# Patient Record
Sex: Female | Born: 1965
Health system: Southern US, Community
[De-identification: ages and names within clinical notes are randomized; demographics above are authoritative.]

## PROBLEM LIST (undated history)

## (undated) DIAGNOSIS — R112 Nausea with vomiting, unspecified: Secondary | ICD-10-CM

## (undated) DIAGNOSIS — R51 Headache: Secondary | ICD-10-CM

## (undated) DIAGNOSIS — C50919 Malignant neoplasm of unspecified site of unspecified female breast: Secondary | ICD-10-CM

## (undated) DIAGNOSIS — Z923 Personal history of irradiation: Secondary | ICD-10-CM

## (undated) DIAGNOSIS — Z8719 Personal history of other diseases of the digestive system: Secondary | ICD-10-CM

## (undated) DIAGNOSIS — Z8669 Personal history of other diseases of the nervous system and sense organs: Secondary | ICD-10-CM

## (undated) DIAGNOSIS — Z9889 Other specified postprocedural states: Secondary | ICD-10-CM

## (undated) DIAGNOSIS — K219 Gastro-esophageal reflux disease without esophagitis: Secondary | ICD-10-CM

## (undated) DIAGNOSIS — D0510 Intraductal carcinoma in situ of unspecified breast: Secondary | ICD-10-CM

## (undated) HISTORY — DX: Personal history of other diseases of the digestive system: Z87.19

## (undated) HISTORY — DX: Personal history of other diseases of the nervous system and sense organs: Z86.69

## (undated) HISTORY — DX: Malignant neoplasm of unspecified site of unspecified female breast: C50.919

---

## 2006-12-12 HISTORY — PX: ABDOMINAL HYSTERECTOMY: SHX81

## 2008-12-12 HISTORY — PX: BREAST LUMPECTOMY: SHX2

## 2009-10-07 ENCOUNTER — Encounter: Admission: RE | Admit: 2009-10-07 | Discharge: 2009-10-07 | Payer: Self-pay | Admitting: General Surgery

## 2009-10-12 ENCOUNTER — Encounter: Admission: RE | Admit: 2009-10-12 | Discharge: 2009-10-12 | Payer: Self-pay | Admitting: General Surgery

## 2009-10-12 ENCOUNTER — Encounter (INDEPENDENT_AMBULATORY_CARE_PROVIDER_SITE_OTHER): Payer: Self-pay | Admitting: Diagnostic Radiology

## 2009-10-12 DIAGNOSIS — C50919 Malignant neoplasm of unspecified site of unspecified female breast: Secondary | ICD-10-CM

## 2009-10-12 HISTORY — DX: Malignant neoplasm of unspecified site of unspecified female breast: C50.919

## 2009-10-16 ENCOUNTER — Encounter: Admission: RE | Admit: 2009-10-16 | Discharge: 2009-10-16 | Payer: Self-pay | Admitting: General Surgery

## 2009-12-07 ENCOUNTER — Ambulatory Visit: Payer: Self-pay | Admitting: Oncology

## 2009-12-16 ENCOUNTER — Ambulatory Visit: Admission: RE | Admit: 2009-12-16 | Discharge: 2010-03-12 | Payer: Self-pay | Admitting: Radiation Oncology

## 2009-12-17 ENCOUNTER — Encounter: Payer: Self-pay | Admitting: Internal Medicine

## 2009-12-21 ENCOUNTER — Encounter: Payer: Self-pay | Admitting: Internal Medicine

## 2009-12-21 LAB — COMPREHENSIVE METABOLIC PANEL
AST: 12 U/L (ref 0–37)
Alkaline Phosphatase: 47 U/L (ref 39–117)
BUN: 13 mg/dL (ref 6–23)
Calcium: 9.6 mg/dL (ref 8.4–10.5)
Chloride: 104 mEq/L (ref 96–112)
Creatinine, Ser: 0.63 mg/dL (ref 0.40–1.20)
Total Bilirubin: 0.5 mg/dL (ref 0.3–1.2)

## 2009-12-21 LAB — CBC WITH DIFFERENTIAL/PLATELET
BASO%: 0.5 % (ref 0.0–2.0)
Basophils Absolute: 0 10*3/uL (ref 0.0–0.1)
EOS%: 3.1 % (ref 0.0–7.0)
HCT: 38.6 % (ref 34.8–46.6)
HGB: 13.4 g/dL (ref 11.6–15.9)
LYMPH%: 36.9 % (ref 14.0–49.7)
MCH: 32 pg (ref 25.1–34.0)
MCHC: 34.7 g/dL (ref 31.5–36.0)
MCV: 92.3 fL (ref 79.5–101.0)
MONO%: 5.7 % (ref 0.0–14.0)
NEUT%: 53.8 % (ref 38.4–76.8)

## 2009-12-24 ENCOUNTER — Encounter: Payer: Self-pay | Admitting: Oncology

## 2009-12-24 ENCOUNTER — Ambulatory Visit: Admission: RE | Admit: 2009-12-24 | Discharge: 2009-12-24 | Payer: Self-pay | Admitting: Oncology

## 2009-12-28 ENCOUNTER — Ambulatory Visit (HOSPITAL_BASED_OUTPATIENT_CLINIC_OR_DEPARTMENT_OTHER): Admission: RE | Admit: 2009-12-28 | Discharge: 2009-12-28 | Payer: Self-pay | Admitting: General Surgery

## 2010-01-01 ENCOUNTER — Encounter: Payer: Self-pay | Admitting: Internal Medicine

## 2010-01-01 LAB — CBC WITH DIFFERENTIAL/PLATELET
Basophils Absolute: 0 10*3/uL (ref 0.0–0.1)
EOS%: 0 % (ref 0.0–7.0)
Eosinophils Absolute: 0 10*3/uL (ref 0.0–0.5)
HCT: 38.9 % (ref 34.8–46.6)
HGB: 13.7 g/dL (ref 11.6–15.9)
LYMPH%: 7 % — ABNORMAL LOW (ref 14.0–49.7)
MONO#: 0 10*3/uL — ABNORMAL LOW (ref 0.1–0.9)
NEUT#: 9.5 10*3/uL — ABNORMAL HIGH (ref 1.5–6.5)
NEUT%: 92.7 % — ABNORMAL HIGH (ref 38.4–76.8)
Platelets: 194 10*3/uL (ref 145–400)
RBC: 4.45 10*6/uL (ref 3.70–5.45)
WBC: 10.2 10*3/uL (ref 3.9–10.3)

## 2010-01-01 LAB — COMPREHENSIVE METABOLIC PANEL
Albumin: 4.4 g/dL (ref 3.5–5.2)
BUN: 11 mg/dL (ref 6–23)
CO2: 21 mEq/L (ref 19–32)
Glucose, Bld: 107 mg/dL — ABNORMAL HIGH (ref 70–99)
Potassium: 4.2 mEq/L (ref 3.5–5.3)
Sodium: 138 mEq/L (ref 135–145)
Total Bilirubin: 0.4 mg/dL (ref 0.3–1.2)
Total Protein: 7.2 g/dL (ref 6.0–8.3)

## 2010-01-06 ENCOUNTER — Ambulatory Visit: Payer: Self-pay | Admitting: Oncology

## 2010-01-08 ENCOUNTER — Encounter: Payer: Self-pay | Admitting: Internal Medicine

## 2010-01-08 LAB — COMPREHENSIVE METABOLIC PANEL
Albumin: 4.5 g/dL (ref 3.5–5.2)
CO2: 22 mEq/L (ref 19–32)
Chloride: 104 mEq/L (ref 96–112)
Glucose, Bld: 89 mg/dL (ref 70–99)
Potassium: 4.3 mEq/L (ref 3.5–5.3)
Sodium: 137 mEq/L (ref 135–145)
Total Protein: 7.4 g/dL (ref 6.0–8.3)

## 2010-01-08 LAB — CBC WITH DIFFERENTIAL/PLATELET
Eosinophils Absolute: 0 10*3/uL (ref 0.0–0.5)
MONO#: 0 10*3/uL — ABNORMAL LOW (ref 0.1–0.9)
NEUT#: 4.2 10*3/uL (ref 1.5–6.5)
RBC: 4.32 10*6/uL (ref 3.70–5.45)
RDW: 11.7 % (ref 11.2–14.5)
WBC: 5 10*3/uL (ref 3.9–10.3)

## 2010-01-15 ENCOUNTER — Encounter: Payer: Self-pay | Admitting: Internal Medicine

## 2010-01-15 LAB — CBC WITH DIFFERENTIAL/PLATELET
Basophils Absolute: 0 10*3/uL (ref 0.0–0.1)
Eosinophils Absolute: 0 10*3/uL (ref 0.0–0.5)
HGB: 12.8 g/dL (ref 11.6–15.9)
LYMPH%: 17.3 % (ref 14.0–49.7)
MCV: 87.4 fL (ref 79.5–101.0)
MONO#: 0 10*3/uL — ABNORMAL LOW (ref 0.1–0.9)
MONO%: 0.4 % (ref 0.0–14.0)
NEUT#: 3.7 10*3/uL (ref 1.5–6.5)
Platelets: 218 10*3/uL (ref 145–400)
RBC: 4.19 10*6/uL (ref 3.70–5.45)
WBC: 4.5 10*3/uL (ref 3.9–10.3)

## 2010-01-15 LAB — COMPREHENSIVE METABOLIC PANEL
Albumin: 4.1 g/dL (ref 3.5–5.2)
BUN: 14 mg/dL (ref 6–23)
CO2: 21 mEq/L (ref 19–32)
Glucose, Bld: 128 mg/dL — ABNORMAL HIGH (ref 70–99)
Potassium: 3.9 mEq/L (ref 3.5–5.3)
Sodium: 137 mEq/L (ref 135–145)
Total Bilirubin: 0.3 mg/dL (ref 0.3–1.2)
Total Protein: 6.6 g/dL (ref 6.0–8.3)

## 2010-01-25 ENCOUNTER — Encounter: Payer: Self-pay | Admitting: Internal Medicine

## 2010-01-25 LAB — COMPREHENSIVE METABOLIC PANEL
Albumin: 4.4 g/dL (ref 3.5–5.2)
Alkaline Phosphatase: 45 U/L (ref 39–117)
CO2: 22 mEq/L (ref 19–32)
Calcium: 9.7 mg/dL (ref 8.4–10.5)
Chloride: 104 mEq/L (ref 96–112)
Glucose, Bld: 121 mg/dL — ABNORMAL HIGH (ref 70–99)
Potassium: 4.4 mEq/L (ref 3.5–5.3)
Sodium: 137 mEq/L (ref 135–145)
Total Protein: 7.3 g/dL (ref 6.0–8.3)

## 2010-01-25 LAB — CBC WITH DIFFERENTIAL/PLATELET
Basophils Absolute: 0 10*3/uL (ref 0.0–0.1)
Eosinophils Absolute: 0 10*3/uL (ref 0.0–0.5)
HGB: 12.9 g/dL (ref 11.6–15.9)
MONO#: 0 10*3/uL — ABNORMAL LOW (ref 0.1–0.9)
NEUT#: 5 10*3/uL (ref 1.5–6.5)
RBC: 4.1 10*6/uL (ref 3.70–5.45)
RDW: 11.9 % (ref 11.2–14.5)
WBC: 5.9 10*3/uL (ref 3.9–10.3)
lymph#: 0.8 10*3/uL — ABNORMAL LOW (ref 0.9–3.3)

## 2010-02-01 ENCOUNTER — Encounter: Payer: Self-pay | Admitting: Internal Medicine

## 2010-02-01 LAB — CBC WITH DIFFERENTIAL/PLATELET
Eosinophils Absolute: 0 10*3/uL (ref 0.0–0.5)
MCV: 87 fL (ref 79.5–101.0)
MONO%: 0.6 % (ref 0.0–14.0)
NEUT#: 5.2 10*3/uL (ref 1.5–6.5)
RBC: 4.23 10*6/uL (ref 3.70–5.45)
RDW: 12 % (ref 11.2–14.5)
WBC: 6.2 10*3/uL (ref 3.9–10.3)
lymph#: 1 10*3/uL (ref 0.9–3.3)

## 2010-02-01 LAB — COMPREHENSIVE METABOLIC PANEL
AST: 13 U/L (ref 0–37)
Albumin: 4 g/dL (ref 3.5–5.2)
Alkaline Phosphatase: 42 U/L (ref 39–117)
Glucose, Bld: 131 mg/dL — ABNORMAL HIGH (ref 70–99)
Potassium: 4.1 mEq/L (ref 3.5–5.3)
Sodium: 138 mEq/L (ref 135–145)
Total Protein: 6.7 g/dL (ref 6.0–8.3)

## 2010-02-03 ENCOUNTER — Ambulatory Visit: Payer: Self-pay | Admitting: Oncology

## 2010-02-08 ENCOUNTER — Encounter: Payer: Self-pay | Admitting: Internal Medicine

## 2010-02-08 LAB — CBC WITH DIFFERENTIAL/PLATELET
BASO%: 0.1 % (ref 0.0–2.0)
EOS%: 0 % (ref 0.0–7.0)
MCH: 30.6 pg (ref 25.1–34.0)
MCV: 87.9 fL (ref 79.5–101.0)
MONO%: 0.9 % (ref 0.0–14.0)
NEUT#: 7 10*3/uL — ABNORMAL HIGH (ref 1.5–6.5)
RBC: 4.05 10*6/uL (ref 3.70–5.45)
RDW: 12.3 % (ref 11.2–14.5)
nRBC: 0 % (ref 0–0)

## 2010-02-08 LAB — COMPREHENSIVE METABOLIC PANEL
ALT: 22 U/L (ref 0–35)
Alkaline Phosphatase: 45 U/L (ref 39–117)
Sodium: 137 mEq/L (ref 135–145)
Total Bilirubin: 0.3 mg/dL (ref 0.3–1.2)
Total Protein: 6.9 g/dL (ref 6.0–8.3)

## 2010-02-10 ENCOUNTER — Ambulatory Visit: Payer: Self-pay | Admitting: Psychiatry

## 2010-02-15 ENCOUNTER — Encounter: Payer: Self-pay | Admitting: Internal Medicine

## 2010-02-15 LAB — COMPREHENSIVE METABOLIC PANEL
AST: 20 U/L (ref 0–37)
Albumin: 4.2 g/dL (ref 3.5–5.2)
Alkaline Phosphatase: 49 U/L (ref 39–117)
Potassium: 4.3 mEq/L (ref 3.5–5.3)
Sodium: 136 mEq/L (ref 135–145)
Total Bilirubin: 0.3 mg/dL (ref 0.3–1.2)
Total Protein: 7.3 g/dL (ref 6.0–8.3)

## 2010-02-15 LAB — CBC WITH DIFFERENTIAL/PLATELET
BASO%: 0.1 % (ref 0.0–2.0)
EOS%: 0 % (ref 0.0–7.0)
MCH: 30.6 pg (ref 25.1–34.0)
MCHC: 35 g/dL (ref 31.5–36.0)
MCV: 87.3 fL (ref 79.5–101.0)
MONO%: 0.4 % (ref 0.0–14.0)
NEUT#: 6.2 10*3/uL (ref 1.5–6.5)
RBC: 4.25 10*6/uL (ref 3.70–5.45)
RDW: 12.7 % (ref 11.2–14.5)

## 2010-02-18 ENCOUNTER — Encounter: Payer: Self-pay | Admitting: Internal Medicine

## 2010-02-22 LAB — COMPREHENSIVE METABOLIC PANEL
Alkaline Phosphatase: 48 U/L (ref 39–117)
CO2: 22 mEq/L (ref 19–32)
Creatinine, Ser: 0.64 mg/dL (ref 0.40–1.20)
Glucose, Bld: 133 mg/dL — ABNORMAL HIGH (ref 70–99)
Sodium: 137 mEq/L (ref 135–145)
Total Bilirubin: 0.3 mg/dL (ref 0.3–1.2)
Total Protein: 7.1 g/dL (ref 6.0–8.3)

## 2010-02-22 LAB — CBC WITH DIFFERENTIAL/PLATELET
Basophils Absolute: 0 10*3/uL (ref 0.0–0.1)
EOS%: 0 % (ref 0.0–7.0)
LYMPH%: 17.1 % (ref 14.0–49.7)
MCH: 30.4 pg (ref 25.1–34.0)
MCV: 87.3 fL (ref 79.5–101.0)
MONO%: 1 % (ref 0.0–14.0)
Platelets: 226 10*3/uL (ref 145–400)
RBC: 4.24 10*6/uL (ref 3.70–5.45)
RDW: 12.7 % (ref 11.2–14.5)
nRBC: 0 % (ref 0–0)

## 2010-03-01 LAB — CBC WITH DIFFERENTIAL/PLATELET
Basophils Absolute: 0 10*3/uL (ref 0.0–0.1)
Eosinophils Absolute: 0 10*3/uL (ref 0.0–0.5)
HCT: 36.2 % (ref 34.8–46.6)
HGB: 12.6 g/dL (ref 11.6–15.9)
LYMPH%: 13.4 % — ABNORMAL LOW (ref 14.0–49.7)
MCV: 87.4 fL (ref 79.5–101.0)
MONO#: 0 10*3/uL — ABNORMAL LOW (ref 0.1–0.9)
MONO%: 0.4 % (ref 0.0–14.0)
NEUT#: 8 10*3/uL — ABNORMAL HIGH (ref 1.5–6.5)
NEUT%: 86.1 % — ABNORMAL HIGH (ref 38.4–76.8)
Platelets: 248 10*3/uL (ref 145–400)
RBC: 4.14 10*6/uL (ref 3.70–5.45)
WBC: 9.3 10*3/uL (ref 3.9–10.3)

## 2010-03-01 LAB — COMPREHENSIVE METABOLIC PANEL
Alkaline Phosphatase: 47 U/L (ref 39–117)
BUN: 17 mg/dL (ref 6–23)
CO2: 22 mEq/L (ref 19–32)
Creatinine, Ser: 0.64 mg/dL (ref 0.40–1.20)
Glucose, Bld: 162 mg/dL — ABNORMAL HIGH (ref 70–99)
Sodium: 137 mEq/L (ref 135–145)
Total Bilirubin: 0.3 mg/dL (ref 0.3–1.2)
Total Protein: 7.1 g/dL (ref 6.0–8.3)

## 2010-03-22 ENCOUNTER — Ambulatory Visit (HOSPITAL_BASED_OUTPATIENT_CLINIC_OR_DEPARTMENT_OTHER): Admission: RE | Admit: 2010-03-22 | Discharge: 2010-03-22 | Payer: Self-pay | Admitting: General Surgery

## 2010-03-24 ENCOUNTER — Ambulatory Visit: Admission: RE | Admit: 2010-03-24 | Discharge: 2010-05-19 | Payer: Self-pay | Admitting: Radiation Oncology

## 2010-05-18 ENCOUNTER — Encounter: Payer: Self-pay | Admitting: Internal Medicine

## 2010-05-27 ENCOUNTER — Ambulatory Visit: Payer: Self-pay | Admitting: Oncology

## 2010-05-31 LAB — CBC WITH DIFFERENTIAL/PLATELET
Eosinophils Absolute: 0.1 10*3/uL (ref 0.0–0.5)
MCV: 87.8 fL (ref 79.5–101.0)
MONO%: 7 % (ref 0.0–14.0)
NEUT#: 2.7 10*3/uL (ref 1.5–6.5)
RBC: 4.23 10*6/uL (ref 3.70–5.45)
RDW: 11.7 % (ref 11.2–14.5)
WBC: 3.9 10*3/uL (ref 3.9–10.3)

## 2010-05-31 LAB — COMPREHENSIVE METABOLIC PANEL
ALT: 11 U/L (ref 0–35)
AST: 17 U/L (ref 0–37)
Albumin: 4.1 g/dL (ref 3.5–5.2)
Alkaline Phosphatase: 38 U/L — ABNORMAL LOW (ref 39–117)
Glucose, Bld: 78 mg/dL (ref 70–99)
Potassium: 4.4 mEq/L (ref 3.5–5.3)
Sodium: 140 mEq/L (ref 135–145)
Total Protein: 6.8 g/dL (ref 6.0–8.3)

## 2010-06-25 ENCOUNTER — Ambulatory Visit: Admission: RE | Admit: 2010-06-25 | Discharge: 2010-06-25 | Payer: Self-pay | Admitting: Oncology

## 2010-06-25 ENCOUNTER — Ambulatory Visit: Payer: Self-pay | Admitting: Cardiovascular Disease

## 2010-06-25 ENCOUNTER — Encounter: Payer: Self-pay | Admitting: Oncology

## 2010-09-28 ENCOUNTER — Ambulatory Visit: Payer: Self-pay | Admitting: Internal Medicine

## 2010-09-28 DIAGNOSIS — G43909 Migraine, unspecified, not intractable, without status migrainosus: Secondary | ICD-10-CM | POA: Insufficient documentation

## 2010-09-28 DIAGNOSIS — K219 Gastro-esophageal reflux disease without esophagitis: Secondary | ICD-10-CM | POA: Insufficient documentation

## 2010-10-21 ENCOUNTER — Ambulatory Visit: Payer: Self-pay | Admitting: Internal Medicine

## 2010-10-21 LAB — CONVERTED CEMR LAB
Blood in Urine, dipstick: NEGATIVE
Nitrite: NEGATIVE
Protein, U semiquant: NEGATIVE
Urobilinogen, UA: 0.2
WBC Urine, dipstick: NEGATIVE

## 2010-10-25 ENCOUNTER — Encounter: Admission: RE | Admit: 2010-10-25 | Discharge: 2010-10-25 | Payer: Self-pay | Admitting: Oncology

## 2010-10-27 LAB — CONVERTED CEMR LAB
ALT: 18 units/L (ref 0–35)
AST: 20 units/L (ref 0–37)
Alkaline Phosphatase: 40 units/L (ref 39–117)
Basophils Absolute: 0 10*3/uL (ref 0.0–0.1)
Calcium: 9.3 mg/dL (ref 8.4–10.5)
Eosinophils Relative: 2.1 % (ref 0.0–5.0)
GFR calc non Af Amer: 113.15 mL/min (ref >60)
Glucose, Bld: 78 mg/dL (ref 70–99)
HCT: 38.5 % (ref 36.0–46.0)
HDL: 48.1 mg/dL (ref >39.00)
Hemoglobin: 13.2 g/dL (ref 12.0–15.0)
LDL Cholesterol: 88 mg/dL (ref 0–99)
Lymphocytes Relative: 33 % (ref 12.0–46.0)
Lymphs Abs: 1.6 10*3/uL (ref 0.7–4.0)
Monocytes Relative: 5.5 % (ref 3.0–12.0)
Neutro Abs: 2.9 10*3/uL (ref 1.4–7.7)
Platelets: 181 10*3/uL (ref 150.0–400.0)
Potassium: 5.6 meq/L — ABNORMAL HIGH (ref 3.5–5.1)
RDW: 12 % (ref 11.5–14.6)
Sodium: 141 meq/L (ref 135–145)
Total Bilirubin: 0.6 mg/dL (ref 0.3–1.2)
VLDL: 13 mg/dL (ref 0.0–40.0)
WBC: 4.8 10*3/uL (ref 4.5–10.5)

## 2010-11-26 ENCOUNTER — Ambulatory Visit: Payer: Self-pay | Admitting: Oncology

## 2010-11-29 LAB — COMPREHENSIVE METABOLIC PANEL
Alkaline Phosphatase: 48 U/L (ref 39–117)
Creatinine, Ser: 1.03 mg/dL (ref 0.40–1.20)
Glucose, Bld: 127 mg/dL — ABNORMAL HIGH (ref 70–99)
Sodium: 138 mEq/L (ref 135–145)
Total Bilirubin: 0.6 mg/dL (ref 0.3–1.2)
Total Protein: 6.9 g/dL (ref 6.0–8.3)

## 2010-11-29 LAB — CBC WITH DIFFERENTIAL/PLATELET
Eosinophils Absolute: 0.1 10*3/uL (ref 0.0–0.5)
LYMPH%: 29 % (ref 14.0–49.7)
MCHC: 35 g/dL (ref 31.5–36.0)
MCV: 89.5 fL (ref 79.5–101.0)
MONO%: 4 % (ref 0.0–14.0)
NEUT#: 3.6 10*3/uL (ref 1.5–6.5)
NEUT%: 64.8 % (ref 38.4–76.8)
Platelets: 191 10*3/uL (ref 145–400)
RBC: 4.51 10*6/uL (ref 3.70–5.45)

## 2011-01-02 ENCOUNTER — Encounter: Payer: Self-pay | Admitting: General Surgery

## 2011-01-11 NOTE — Letter (Signed)
[facility]   Imported By: Maryln Gottron 02/19/2010 09:59:26  _____________________________________________________________________  External Attachment:    Type:   Image     Comment:   External Document

## 2011-01-11 NOTE — Letter (Signed)
[facility]   Imported By: Maryln Gottron 02/05/2010 13:52:49  _____________________________________________________________________  External Attachment:    Type:   Image     Comment:   External Document

## 2011-01-11 NOTE — Letter (Signed)
Summary: Patient History Form  Patient History Form   Imported By: Maryln Gottron 10/14/2010 13:14:48  _____________________________________________________________________  External Attachment:    Type:   Image     Comment:   External Document

## 2011-01-11 NOTE — Letter (Signed)
[facility]   Imported By: Maryln Gottron 01/13/2010 11:13:55  _____________________________________________________________________  External Attachment:    Type:   Image     Comment:   External Document

## 2011-01-11 NOTE — Letter (Signed)
[facility]   Imported By: Maryln Gottron 02/19/2010 10:00:49  _____________________________________________________________________  External Attachment:    Type:   Image     Comment:   External Document

## 2011-01-11 NOTE — Letter (Signed)
[facility]   Imported By: Maryln Gottron 01/29/2010 15:17:50  _____________________________________________________________________  External Attachment:    Type:   Image     Comment:   External Document

## 2011-01-11 NOTE — Letter (Signed)
[facility]   Imported By: Maryln Gottron 03/04/2010 15:25:07  _____________________________________________________________________  External Attachment:    Type:   Image     Comment:   External Document

## 2011-01-11 NOTE — Letter (Signed)
[facility]   Imported By: Maryln Gottron 06/16/2010 12:50:34  _____________________________________________________________________  External Attachment:    Type:   Image     Comment:   External Document

## 2011-01-11 NOTE — Assessment & Plan Note (Signed)
Summary: New pt to get establish and flu shot/ssc   Vital Signs:  Patient profile:   45 year old female Menstrual status:  hysterectomy Height:      61 inches (154.94 cm) Weight:      129 pounds (58.64 kg) BMI:     24.46 O2 Sat:      98 % on Room air Temp:     98.6 degrees F (37.00 degrees C) oral Pulse rate:   82 / minute BP sitting:   102 / 70  (left arm) Cuff size:   regular  Vitals Entered By: Josph Macho RMA (September 28, 2010 10:18 AM)  O2 Flow:  Room air CC: Establish new patient/ CF Is Patient Diabetic? No     Menstrual Status hysterectomy   History of Present Illness: Michelle Wagner comes in today   for new patient visit . Previous care had been at [facility] and locally now with Dr Park Breed  for follow up Breast Cancer, Dr Dayton Scrape and Dr Donell Beers.   She is generally well but has  Discovered lump herself and  had neg   mammo   months previously . december 2010 when she had a lumpectomy . ( see  cancer center notes) She is doing well now and comes to establish for PCP.  She is utd on preventive parameters so far  No fractures.     Also has had gerd and migraines   that is stable and she is on no chronic meds for this.   Preventive Screening-Counseling & Management  Alcohol-Tobacco     Alcohol drinks/day: 0     Smoking Status: never  Caffeine-Diet-Exercise     Caffeine use/day: 1-2 teabeginning   Hep-HIV-STD-Contraception     Dental Visit-last 6 months yes  Safety-Violence-Falls     Seat Belt Use: yes     Firearms in the Home: no firearms in the home     Smoke Detectors: yes      Blood Transfusions:  no.    Current Medications (verified): 1)  Multivitamins .... Once Daily  Allergies (verified): No Known Drug Allergies  Past History:  Past Medical History: GERD  endoscopy  2 years ago  stable Migraines        G4 P4  Right lumpectomy and  tx Her pos ert neg  DCIS and multilocal invasive  N0M0 Herceptin and radiation  4-6 /2011 radiation   Past Surgical  History: Hysterectomy2007 bleeding  benign cause.  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         2 aleve and tylenol for as needed HA  some gerd  with endo  Flu Vaccine Consent Questions     Do you have a history of severe allergic reactions to this vaccine? no    Any prior history of allergic reactions to egg and/or gelatin? no    Do you have a sensitivity to the preservative Thimersol? no  Do you have a past history of Guillan-Barre Syndrome? no    Do you currently have an acute febrile illness? no    Have you ever had a severe reaction to latex? no    Vaccine information given and explained to patient? yes    Are you currently pregnant? no    Lot Number:[id number]   Exp Date:06/11/2011   Site Given  Left Deltoid IM Josph Macho RMA  September 28, 2010 10:25 AM   Physical Exam  General:  alert, well-developed, well-nourished, and well-hydrated.   Head:  normocephalic and atraumatic.   Eyes:  vision grossly intact.   Ears:  R ear normal and L ear normal.   Neck:  No deformities, masses, or tenderness noted. Lungs:  Normal respiratory effort, chest expands symmetrically. Lungs are clear to auscultation, no crackles or wheezes.no dullness.   Heart:  Normal rate and regular rhythm. S1 and S2 normal without gallop, murmur, click, rub  or other extra sounds.no lifts.   Abdomen:  Bowel sounds positive,abdomen soft and non-tender without masses, organomegaly or   noted. Msk:  no joint warmth and no redness over joints.   Pulses:  pulses intact without delay   Extremities:  no clubbing cyanosis or edema  Neurologic:  alert & oriented X3 and gait normal.  grossly non focal  Skin:  turgor normal, color normal, no ecchymoses, no petechiae, and no purpura.   Cervical Nodes:  No lymphadenopathy noted Psych:  Oriented X3, good eye contact, not anxious appearing, and not depressed appearing.     Impression & Recommendations:  Problem # 1:  BREAST CANCER (ICD-174.9)  right diagnosed   10 months ago  doing well   has been under actve rx   unsure of bone health status   Problem # 2:  GERD (ICD-530.81) stable  has had endo in past  Problem # 3:  MIGRAINE HEADACHE (ICD-346.90) stable on no reg meds   Complete Medication List: 1)  Multivitamins  .... Once daily  Other Orders: Admin 1st Vaccine (16109) Flu Vaccine 90yrs + (60454) Tdap => 37yrs IM (09811) Admin of Any Addtl Vaccine (91478)  Patient Instructions: 1)  cpx labs fasting . 2)  You will be informed of lab results when available.  3)  if ok.   then  can do   check up in  10-12 months.  4)  Ask oncology about bone density.  5)  call in meantime if needed.    Orders Added: 1)  Admin 1st Vaccine [90471] 2)  Flu Vaccine 79yrs + [29562] 3)  Tdap => 44yrs IM [90715] 4)  Admin of Any Addtl Vaccine [90472] 5)  New Patient Level III [13086]   Immunizations Administered:  Tetanus Vaccine:    Vaccine Type: Tdap    Site: right deltoid    Mfr: GlaxoSmithKline    Dose: 0.5 ml    Route: IM    Given by: Pura Spice, RN    Exp. Date: 09/30/2012    Lot #: [id number]    VIS given: 10/29/08 version given September 28, 2010.   Immunizations Administered:  Tetanus Vaccine:    Vaccine Type: Tdap    Site: right deltoid    Mfr: GlaxoSmithKline    Dose: 0.5 ml     Route: IM    Given by: Pura Spice, RN    Exp. Date: 09/30/2012    Lot #: [id number]    VIS given: 10/29/08 version given September 28, 2010.

## 2011-01-11 NOTE — Letter (Signed)
[facility]   Imported By: Maryln Gottron 04/08/2010 12:34:38  _____________________________________________________________________  External Attachment:    Type:   Image     Comment:   External Document

## 2011-01-11 NOTE — Letter (Signed)
[facility]   Imported By: Maryln Gottron 03/04/2010 15:26:24  _____________________________________________________________________  External Attachment:    Type:   Image     Comment:   External Document

## 2011-01-11 NOTE — Letter (Signed)
[facility]   Imported By: Maryln Gottron 01/29/2010 15:16:31  _____________________________________________________________________  External Attachment:    Type:   Image     Comment:   External Document

## 2011-01-11 NOTE — Consult Note (Signed)
Summary: [facility]   Imported By: Maryln Gottron 01/01/2010 14:16:04  _____________________________________________________________________  External Attachment:    Type:   Image     Comment:   External Document

## 2011-02-28 LAB — URINALYSIS, ROUTINE W REFLEX MICROSCOPIC
Glucose, UA: NEGATIVE mg/dL
Hgb urine dipstick: NEGATIVE
Protein, ur: NEGATIVE mg/dL
Specific Gravity, Urine: 1.009 (ref 1.005–1.030)
pH: 7 (ref 5.0–8.0)

## 2011-03-02 LAB — CBC
HCT: 36 % (ref 36.0–46.0)
Hemoglobin: 12.6 g/dL (ref 12.0–15.0)
MCV: 89.8 fL (ref 78.0–100.0)
RBC: 4.01 MIL/uL (ref 3.87–5.11)
WBC: 8.6 10*3/uL (ref 4.0–10.5)

## 2011-03-02 LAB — DIFFERENTIAL
Eosinophils Absolute: 0.2 10*3/uL (ref 0.0–0.7)
Eosinophils Relative: 3 % (ref 0–5)
Lymphocytes Relative: 31 % (ref 12–46)
Lymphs Abs: 2.7 10*3/uL (ref 0.7–4.0)
Monocytes Absolute: 0.5 10*3/uL (ref 0.1–1.0)
Monocytes Relative: 6 % (ref 3–12)

## 2011-03-02 LAB — PROTIME-INR
INR: 0.87 (ref 0.00–1.49)
Prothrombin Time: 11.8 seconds (ref 11.6–15.2)

## 2011-05-31 ENCOUNTER — Other Ambulatory Visit: Payer: Self-pay | Admitting: Oncology

## 2011-05-31 ENCOUNTER — Encounter (HOSPITAL_BASED_OUTPATIENT_CLINIC_OR_DEPARTMENT_OTHER): Payer: BC Managed Care – PPO | Admitting: Oncology

## 2011-05-31 DIAGNOSIS — C50419 Malignant neoplasm of upper-outer quadrant of unspecified female breast: Secondary | ICD-10-CM

## 2011-05-31 DIAGNOSIS — Z171 Estrogen receptor negative status [ER-]: Secondary | ICD-10-CM

## 2011-05-31 LAB — CBC WITH DIFFERENTIAL/PLATELET
BASO%: 0.4 % (ref 0.0–2.0)
Eosinophils Absolute: 0.1 10*3/uL (ref 0.0–0.5)
LYMPH%: 29.6 % (ref 14.0–49.7)
MCHC: 35.2 g/dL (ref 31.5–36.0)
MONO#: 0.4 10*3/uL (ref 0.1–0.9)
NEUT#: 3.7 10*3/uL (ref 1.5–6.5)
Platelets: 187 10*3/uL (ref 145–400)
RBC: 4.43 10*6/uL (ref 3.70–5.45)
RDW: 11.6 % (ref 11.2–14.5)
WBC: 6 10*3/uL (ref 3.9–10.3)
lymph#: 1.8 10*3/uL (ref 0.9–3.3)

## 2011-05-31 LAB — COMPREHENSIVE METABOLIC PANEL
ALT: 12 U/L (ref 0–35)
Albumin: 4.5 g/dL (ref 3.5–5.2)
CO2: 22 mEq/L (ref 19–32)
Chloride: 106 mEq/L (ref 96–112)
Glucose, Bld: 84 mg/dL (ref 70–99)
Potassium: 4.4 mEq/L (ref 3.5–5.3)
Sodium: 137 mEq/L (ref 135–145)
Total Bilirubin: 0.5 mg/dL (ref 0.3–1.2)
Total Protein: 7.2 g/dL (ref 6.0–8.3)

## 2011-06-14 ENCOUNTER — Ambulatory Visit (HOSPITAL_COMMUNITY)
Admission: RE | Admit: 2011-06-14 | Discharge: 2011-06-14 | Disposition: A | Discharge status: Home / Self Care | Payer: BC Managed Care – PPO | Source: Ambulatory Visit | Attending: Oncology | Admitting: Oncology

## 2011-06-14 DIAGNOSIS — C50919 Malignant neoplasm of unspecified site of unspecified female breast: Secondary | ICD-10-CM | POA: Insufficient documentation

## 2011-06-14 DIAGNOSIS — I379 Nonrheumatic pulmonary valve disorder, unspecified: Secondary | ICD-10-CM | POA: Insufficient documentation

## 2011-09-12 ENCOUNTER — Ambulatory Visit (INDEPENDENT_AMBULATORY_CARE_PROVIDER_SITE_OTHER): Payer: BC Managed Care – PPO

## 2011-09-12 DIAGNOSIS — Z23 Encounter for immunization: Secondary | ICD-10-CM

## 2011-10-14 ENCOUNTER — Other Ambulatory Visit: Payer: Self-pay | Admitting: Oncology

## 2011-10-14 DIAGNOSIS — Z9889 Other specified postprocedural states: Secondary | ICD-10-CM

## 2011-10-14 DIAGNOSIS — Z853 Personal history of malignant neoplasm of breast: Secondary | ICD-10-CM

## 2011-11-23 ENCOUNTER — Ambulatory Visit
Admission: RE | Admit: 2011-11-23 | Discharge: 2011-11-23 | Disposition: A | Discharge status: Home / Self Care | Payer: BC Managed Care – PPO | Source: Ambulatory Visit | Attending: Oncology | Admitting: Oncology

## 2011-11-23 DIAGNOSIS — Z853 Personal history of malignant neoplasm of breast: Secondary | ICD-10-CM

## 2011-11-23 DIAGNOSIS — Z9889 Other specified postprocedural states: Secondary | ICD-10-CM

## 2011-11-25 ENCOUNTER — Telehealth: Payer: Self-pay | Admitting: *Deleted

## 2011-11-25 NOTE — Telephone Encounter (Signed)
Pt.notified

## 2011-11-25 NOTE — Telephone Encounter (Signed)
Message copied by Cooper Render on Fri Nov 25, 2011 10:46 AM ------      Message from: Victorino December      Created: Thu Nov 24, 2011  9:44 PM       Call patient:mammogram looks great

## 2011-12-11 ENCOUNTER — Emergency Department (HOSPITAL_COMMUNITY)
Admission: EM | Admit: 2011-12-11 | Discharge: 2011-12-11 | Disposition: A | Discharge status: Home / Self Care | Payer: BC Managed Care – PPO

## 2011-12-29 ENCOUNTER — Other Ambulatory Visit (HOSPITAL_BASED_OUTPATIENT_CLINIC_OR_DEPARTMENT_OTHER): Payer: BC Managed Care – PPO | Admitting: Lab

## 2011-12-29 ENCOUNTER — Ambulatory Visit (HOSPITAL_BASED_OUTPATIENT_CLINIC_OR_DEPARTMENT_OTHER): Payer: BC Managed Care – PPO | Admitting: Oncology

## 2011-12-29 ENCOUNTER — Telehealth: Payer: Self-pay | Admitting: Oncology

## 2011-12-29 DIAGNOSIS — Z853 Personal history of malignant neoplasm of breast: Secondary | ICD-10-CM

## 2011-12-29 DIAGNOSIS — Z171 Estrogen receptor negative status [ER-]: Secondary | ICD-10-CM

## 2011-12-29 DIAGNOSIS — D649 Anemia, unspecified: Secondary | ICD-10-CM

## 2011-12-29 DIAGNOSIS — C50419 Malignant neoplasm of upper-outer quadrant of unspecified female breast: Secondary | ICD-10-CM

## 2011-12-29 DIAGNOSIS — C50919 Malignant neoplasm of unspecified site of unspecified female breast: Secondary | ICD-10-CM

## 2011-12-29 DIAGNOSIS — Z9221 Personal history of antineoplastic chemotherapy: Secondary | ICD-10-CM

## 2011-12-29 DIAGNOSIS — Z923 Personal history of irradiation: Secondary | ICD-10-CM

## 2011-12-29 LAB — CBC WITH DIFFERENTIAL/PLATELET
BASO%: 0.5 % (ref 0.0–2.0)
LYMPH%: 28.8 % (ref 14.0–49.7)
MCHC: 34.8 g/dL (ref 31.5–36.0)
MONO#: 0.3 10*3/uL (ref 0.1–0.9)
MONO%: 4.9 % (ref 0.0–14.0)
NEUT#: 4.5 10*3/uL (ref 1.5–6.5)
Platelets: 205 10*3/uL (ref 145–400)
RBC: 4.67 10*6/uL (ref 3.70–5.45)
RDW: 11.6 % (ref 11.2–14.5)
WBC: 7.1 10*3/uL (ref 3.9–10.3)

## 2011-12-29 NOTE — Progress Notes (Signed)
Pt seen, and 3 generation FH taken.  Pt interested in BART testing. Plan to obtain records from [facility].  Once reviewed, may reconsider BART testing at next visit.

## 2011-12-29 NOTE — Telephone Encounter (Signed)
gve the pt her June 2013 appt calendar 

## 2011-12-30 LAB — COMPREHENSIVE METABOLIC PANEL
ALT: 14 U/L (ref 0–35)
Albumin: 4.4 g/dL (ref 3.5–5.2)
Alkaline Phosphatase: 54 U/L (ref 39–117)
CO2: 26 mEq/L (ref 19–32)
Potassium: 4.7 mEq/L (ref 3.5–5.3)
Sodium: 137 mEq/L (ref 135–145)
Total Bilirubin: 0.5 mg/dL (ref 0.3–1.2)
Total Protein: 7.3 g/dL (ref 6.0–8.3)

## 2012-01-02 NOTE — Progress Notes (Signed)
OFFICE PROGRESS NOTE  CC  Michelle Harp, MD, MD [address] [place] [address] Dr. Almond Lint Dr. Maxie Better Dr. Chipper Herb  DIAGNOSIS: 46 year old female with stage I multifocal ER negative PR negative HER-2/neu positive invasive ductal carcinoma with associated ductal carcinoma in situ diagnosed in November 2010   PRIOR THERAPY:  #1 patient underwent a lumpectomy at [facility] with a sentinel node biopsy on 11/13/2009. The final pathology revealed a multifocal invasive ductal carcinoma with 3 foci of tumor all of which measured less than 2 mm. There was no associated LDI. There was high grade ductal carcinoma in situ noted with extensive necrosis. The tumor was ER negative PR negative HER-2/neu positive with a ratio of 5.28. All sentinel nodes were negative for metastatic disease.  #2 she received adjuvant systemic chemotherapy consisting of Taxol and Herceptin administered from 01/01/2010 to 03/01/2010. Overall she tolerated the systemic treatment well.  #3 patient went on to complete radiation therapy with curative intent from 04/06/2010 through 05/18/2010 to the right breast for a total of 6000 gray.  #4 patient did have genetic counseling and testing performed at [facility] for the BRCA1 and BRCA2 complete analysis. Reportedly this was normal and she did not harbor a mutation for either BRCA1 or BRCA2 gene.  CURRENT THERAPY: Observation  INTERVAL HISTORY: Michelle Wagner 46 y.o. female returns for followup visit today. She was last seen by me in June 2012. Clinically she seems to be doing well and is without any major complaints. She however does complain of having some dizziness off-and-on. She has also noticed in her right big toe some numbness over the last several months. It is some vague off. But it is not associated with any other sites of numbness or tingling. She is denying any headaches double vision blurring of vision she has no  difficulty in swallowing she denies any cough shortness of breath chest pains palpitations she has no hemoptysis hematemesis no abdominal pain no diarrhea or constipation no hematuria hematochezia melena hemoptysis. He has no weakness in her lower extremities. Patient did have an echocardiogram performed recently and this was with good ejection fraction. It is documented separately in the electronic medical record. Remainder of the 10 point review of systems is negative.  MEDICAL HISTORY:No past medical history on file.  ALLERGIES:   has no known allergies.  MEDICATIONS:  Current Outpatient Prescriptions  Medication Sig Dispense Refill  . b complex vitamins capsule Take 1 capsule by mouth daily.      . Calcium Carbonate-Vitamin D (CALCIUM + D PO) Take 1 tablet by mouth daily.      . Cholecalciferol (VITAMIN D3) 1000 UNITS CAPS Take 1 capsule by mouth daily.      . Multiple Vitamin (MULTIVITAMIN) tablet Take 1 tablet by mouth daily.        SURGICAL HISTORY: No past surgical history on file.  REVIEW OF SYSTEMS:  Pertinent items are noted in HPI.   PHYSICAL EXAMINATION: General appearance: alert, cooperative and appears stated age Head: Normocephalic, without obvious abnormality, atraumatic Neck: no adenopathy, no carotid bruit, no JVD, supple, symmetrical, trachea midline and thyroid not enlarged, symmetric, no tenderness/mass/nodules Lymph nodes: Cervical, supraclavicular, and axillary nodes normal. Resp: clear to auscultation bilaterally and normal percussion bilaterally Back: symmetric, no curvature. ROM normal. No CVA tenderness. Cardio: regular rate and rhythm, S1, S2 normal, no murmur, click, rub or gallop and normal apical impulse GI: soft, non-tender; bowel sounds normal; no masses,  no organomegaly  Extremities: extremities normal, atraumatic, no cyanosis or edema Neurologic: Alert and oriented X 3, normal strength and tone. Normal symmetric reflexes. Normal coordination and  gait  ECOG PERFORMANCE STATUS: 0 - Asymptomatic  Blood pressure 110/68, pulse 64, temperature 98.8 F (37.1 C), temperature source Oral, height 5' (1.524 m), weight 123 lb 12.8 oz (56.155 kg).  LABORATORY DATA: Lab Results  Component Value Date   WBC 7.1 12/29/2011   HGB 14.5 12/29/2011   HCT 41.6 12/29/2011   MCV 89.2 12/29/2011   PLT 205 12/29/2011      Chemistry      Component Value Date/Time   NA 137 12/29/2011 1119   K 4.7 12/29/2011 1119   CL 103 12/29/2011 1119   CO2 26 12/29/2011 1119   BUN 13 12/29/2011 1119   CREATININE 0.84 12/29/2011 1119      Component Value Date/Time   CALCIUM 9.8 12/29/2011 1119   ALKPHOS 54 12/29/2011 1119   AST 18 12/29/2011 1119   ALT 14 12/29/2011 1119   BILITOT 0.5 12/29/2011 1119       RADIOGRAPHIC STUDIES:  No results found.  ASSESSMENT: A very pleasant 46 year old female with stage I multifocal ER negative PR negative HER-2/neu positive invasive ductal carcinoma with associated DCIS of the right breast originally diagnosed in November 2010. She had a lumpectomy with sentinel node biopsy at [facility] on 11/13/2009. The final pathology revealed multifocal invasive ductal carcinoma with 3 foci of tumor all of which measure less than 2 mm no LDL 8. Tumor was again ER negative PR negative HER-2/neu positive with a ratio of 5.28 all sentinel nodes were negative for metastatic disease. She has gone on to receive adjuvant systemic chemotherapy for consisting of Taxol and Herceptin from 12/19/2009 to March 2008 11. She has completed radiation therapy as of 05/18/2010. She tolerated all of her treatments very well. She has also had genetic testing performed for the BRCA1 and 2 gene mutation. She and I did discuss the possibility of doing part. She did meet with the genetic counselor today Maylon Cos who discussed her family history she did a 3 generation family history. Patient certainly is interested in part testing. We are planning on getting her  records from [facility] and once they are reviewed we may consider doing Bart testing at her next visit.   PLAN: Patient will be seen back in 6 months time. She will be seen in the survivor clinic with Colman Cater. All questions are answered today. She knows to call me with any questions or concerns per   All questions were answered. The patient knows to call the clinic with any problems, questions or concerns. We can certainly see the patient much sooner if necessary.  I spent 25 minutes counseling the patient face to face. The total time spent in the appointment was 30 minutes.    Drue Second, MD Medical/Oncology [facility] [phone] (beeper) [phone] (Office)  01/02/2012, 11:10 PM

## 2012-01-03 ENCOUNTER — Telehealth: Payer: Self-pay | Admitting: *Deleted

## 2012-01-03 NOTE — Telephone Encounter (Signed)
Notified pt to start Vitamin D3 1000 units daily

## 2012-01-03 NOTE — Telephone Encounter (Signed)
Message copied by GARNER, Gerald Leitz on Tue Jan 03, 2012  9:03 AM ------      Message from: Victorino December      Created: Mon Jan 02, 2012 11:56 PM       Call patient: labs look good, take Vitamin D3 OTC 1000 units daily

## 2012-01-12 ENCOUNTER — Telehealth: Payer: Self-pay | Admitting: Genetic Counselor

## 2012-01-19 ENCOUNTER — Encounter: Payer: Self-pay | Admitting: Genetic Counselor

## 2012-05-25 ENCOUNTER — Ambulatory Visit: Payer: BC Managed Care – PPO | Admitting: Family

## 2012-05-25 ENCOUNTER — Other Ambulatory Visit: Payer: BC Managed Care – PPO | Admitting: Lab

## 2012-05-28 ENCOUNTER — Ambulatory Visit: Payer: BC Managed Care – PPO | Admitting: Family

## 2012-05-28 ENCOUNTER — Other Ambulatory Visit: Payer: BC Managed Care – PPO | Admitting: Lab

## 2012-06-11 ENCOUNTER — Ambulatory Visit (HOSPITAL_BASED_OUTPATIENT_CLINIC_OR_DEPARTMENT_OTHER): Payer: BC Managed Care – PPO | Admitting: Oncology

## 2012-06-11 ENCOUNTER — Encounter: Payer: Self-pay | Admitting: Oncology

## 2012-06-11 ENCOUNTER — Telehealth: Payer: Self-pay | Admitting: *Deleted

## 2012-06-11 VITALS — BP 103/70 | HR 69 | Temp 98.7°F | Ht 60.0 in | Wt 125.4 lb

## 2012-06-11 DIAGNOSIS — Z170 Estrogen receptor positive status [ER+]: Secondary | ICD-10-CM

## 2012-06-11 DIAGNOSIS — C50419 Malignant neoplasm of upper-outer quadrant of unspecified female breast: Secondary | ICD-10-CM

## 2012-06-11 DIAGNOSIS — C50919 Malignant neoplasm of unspecified site of unspecified female breast: Secondary | ICD-10-CM

## 2012-06-11 NOTE — Patient Instructions (Addendum)
1. You are doing well.   2. I will continue seeing you once a year with blood work

## 2012-06-11 NOTE — Telephone Encounter (Signed)
06-12-2013 STARTING AT 11:30AM LAB AND MD PRINTED OUT CALENDAR AND GAVE PATIENT

## 2012-06-11 NOTE — Progress Notes (Signed)
OFFICE PROGRESS NOTE  CC  [name], MD [address] [place] [address] Dr. Almond Lint Dr. Maxie Better Dr. Chipper Herb  DIAGNOSIS: 46 year old female with stage I multifocal ER negative PR negative HER-2/neu positive invasive ductal carcinoma with associated ductal carcinoma in situ diagnosed in November 2010   PRIOR THERAPY:  #1 patient underwent a lumpectomy at [facility] with a sentinel node biopsy on 11/13/2009. The final pathology revealed a multifocal invasive ductal carcinoma with 3 foci of tumor all of which measured less than 2 mm. There was no associated LDI. There was high grade ductal carcinoma in situ noted with extensive necrosis. The tumor was ER negative PR negative HER-2/neu positive with a ratio of 5.28. All sentinel nodes were negative for metastatic disease.  #2 she received adjuvant systemic chemotherapy consisting of Taxol and Herceptin administered from 01/01/2010 to 03/01/2010. Overall she tolerated the systemic treatment well.  #3 patient went on to complete radiation therapy with curative intent from 04/06/2010 through 05/18/2010 to the right breast for a total of 6000 gray.  #4 patient did have genetic counseling and testing performed at [facility] for the BRCA1 and BRCA2 complete analysis. Reportedly this was normal and she did not harbor a mutation for either BRCA1 or BRCA2 gene.  CURRENT THERAPY: Observation  INTERVAL HISTORY: Michelle Wagner 46 y.o. female returns for followup visit today.Clinically patient seems to be doing well she denies any fevers chills night sweats headaches shortness of breath chest pains palpitations she does have  fatigue. But she has had a lot going on in her personal life. She has no myalgias or arthralgias she has not noticed any back pain. No changes in her bowel or bladder habits. She has no bleeding problems she has not noticed any masses in her breasts. No shortness of breath no cough  hemoptysis hematemesis no hematuria hematochezia or melena. Remainder of the 10 point review of systems is negative.  MEDICAL HISTORY: Past Medical History  Diagnosis Date  . Breast cancer     ALLERGIES:   has no known allergies.  MEDICATIONS:  Current Outpatient Prescriptions  Medication Sig Dispense Refill  . b complex vitamins capsule Take 1 capsule by mouth daily.      . Calcium Carbonate-Vitamin D (CALCIUM + D PO) Take 1 tablet by mouth daily.      . Cholecalciferol (VITAMIN D3) 1000 UNITS CAPS Take 1 capsule by mouth daily.      . Multiple Vitamin (MULTIVITAMIN) tablet Take 1 tablet by mouth daily.        SURGICAL HISTORY: History reviewed. No pertinent past surgical history.  REVIEW OF SYSTEMS:  Pertinent items are noted in HPI.   PHYSICAL EXAMINATION: General appearance: alert, cooperative and appears stated age Head: Normocephalic, without obvious abnormality, atraumatic Neck: no adenopathy, no carotid bruit, no JVD, supple, symmetrical, trachea midline and thyroid not enlarged, symmetric, no tenderness/mass/nodules Lymph nodes: Cervical, supraclavicular, and axillary nodes normal. Resp: clear to auscultation bilaterally and normal percussion bilaterally Back: symmetric, no curvature. ROM normal. No CVA tenderness. Cardio: regular rate and rhythm, S1, S2 normal, no murmur, click, rub or gallop and normal apical impulse GI: soft, non-tender; bowel sounds normal; no masses,  no organomegaly Extremities: extremities normal, atraumatic, no cyanosis or edema Neurologic: Alert and oriented X 3, normal strength and tone. Normal symmetric reflexes. Normal coordination and gait  ECOG PERFORMANCE STATUS: 0 - Asymptomatic  Blood pressure 103/70, pulse 69, temperature 98.7 F (37.1 C), temperature source Oral,  height 5' (1.524 m), weight 125 lb 6.4 oz (56.881 kg).  LABORATORY DATA: Lab Results  Component Value Date   WBC 7.1 12/29/2011   HGB 14.5 12/29/2011   HCT 41.6 12/29/2011    MCV 89.2 12/29/2011   PLT 205 12/29/2011      Chemistry      Component Value Date/Time   NA 137 12/29/2011 1119   K 4.7 12/29/2011 1119   CL 103 12/29/2011 1119   CO2 26 12/29/2011 1119   BUN 13 12/29/2011 1119   CREATININE 0.84 12/29/2011 1119      Component Value Date/Time   CALCIUM 9.8 12/29/2011 1119   ALKPHOS 54 12/29/2011 1119   AST 18 12/29/2011 1119   ALT 14 12/29/2011 1119   BILITOT 0.5 12/29/2011 1119       RADIOGRAPHIC STUDIES:  No results found.  ASSESSMENT: A very pleasant 46 year old female with :  1. stage I multifocal ER negative PR negative HER-2/neu positive invasive ductal carcinoma with associated DCIS of the right breast originally diagnosed in November 2010. She had a lumpectomy with sentinel node biopsy at [facility] on 11/13/2009. The final pathology revealed multifocal invasive ductal carcinoma with 3 foci of tumor all of which measure less than 2 mm no LVI. Tumor was again ER negative PR negative HER-2/neu positive with a ratio of 5.28 all sentinel nodes were negative for metastatic disease. She has gone on to receive adjuvant systemic chemotherapy for consisting of Taxol and Herceptin from 12/19/2009 to March 2008 11. She has completed radiation therapy as of 05/18/2010. She tolerated all of her treatments very well. She has also had genetic testing performed for the BRCA1 and 2 gene mutation. Patient is without any evidence of recurrent disease.  #2 patient is on surveillance only with mammograms self breast examination and clinical examinations.   PLAN:  1. Patient at this time would like to be seen on a yearly basis. She understands the guidelines stating that we should be seeing her every 6 months for the first 5 years. However she feels very comfortable with being seen once a year. She certainly is very reliable and will be able to call us if there are any problems whatsoever or if she feels she needs to be seen sooner.  #2 I will plan on seeing the  patient back in one years time. She will also need to continue getting her yearly mammograms. All questions were answered. The patient knows to call the clinic with any problems, questions or concerns. We can certainly see the patient much sooner if necessary.  I spent 25 minutes counseling the patient face to face. The total time spent in the appointment was 30 minutes.    Drue Second, MD Medical/Oncology [facility] [phone] (beeper) [phone] (Office)  06/11/2012, 1:40 PM

## 2012-07-31 ENCOUNTER — Other Ambulatory Visit: Payer: BC Managed Care – PPO

## 2012-08-07 ENCOUNTER — Encounter: Payer: BC Managed Care – PPO | Admitting: Internal Medicine

## 2012-09-27 ENCOUNTER — Other Ambulatory Visit (INDEPENDENT_AMBULATORY_CARE_PROVIDER_SITE_OTHER): Payer: BC Managed Care – PPO

## 2012-09-27 DIAGNOSIS — Z0000 Encounter for general adult medical examination without abnormal findings: Secondary | ICD-10-CM

## 2012-09-27 DIAGNOSIS — C50919 Malignant neoplasm of unspecified site of unspecified female breast: Secondary | ICD-10-CM

## 2012-09-27 DIAGNOSIS — D649 Anemia, unspecified: Secondary | ICD-10-CM

## 2012-09-27 LAB — CBC WITH DIFFERENTIAL/PLATELET
Basophils Absolute: 0 10*3/uL (ref 0.0–0.1)
Hemoglobin: 13.9 g/dL (ref 12.0–15.0)
Lymphocytes Relative: 33 % (ref 12.0–46.0)
Monocytes Relative: 5.7 % (ref 3.0–12.0)
Neutro Abs: 2.8 10*3/uL (ref 1.4–7.7)
RBC: 4.52 Mil/uL (ref 3.87–5.11)
RDW: 12.1 % (ref 11.5–14.6)
WBC: 4.9 10*3/uL (ref 4.5–10.5)

## 2012-09-27 LAB — POCT URINALYSIS DIPSTICK
Leukocytes, UA: NEGATIVE
Nitrite, UA: NEGATIVE
Protein, UA: NEGATIVE
Urobilinogen, UA: 0.2

## 2012-09-27 LAB — BASIC METABOLIC PANEL
Calcium: 9.3 mg/dL (ref 8.4–10.5)
GFR: 97.3 mL/min (ref >60.00)
Glucose, Bld: 87 mg/dL (ref 70–99)
Sodium: 141 mEq/L (ref 135–145)

## 2012-09-27 LAB — LIPID PANEL
LDL Cholesterol: 66 mg/dL (ref 0–99)
Total CHOL/HDL Ratio: 2
VLDL: 16.2 mg/dL (ref 0.0–40.0)

## 2012-09-27 LAB — HEPATIC FUNCTION PANEL
AST: 18 U/L (ref 0–37)
Albumin: 3.7 g/dL (ref 3.5–5.2)
Alkaline Phosphatase: 50 U/L (ref 39–117)

## 2012-09-27 LAB — FOLATE: Folate: 17.8 ng/mL (ref >5.9)

## 2012-10-08 ENCOUNTER — Ambulatory Visit (INDEPENDENT_AMBULATORY_CARE_PROVIDER_SITE_OTHER): Payer: BC Managed Care – PPO | Admitting: Internal Medicine

## 2012-10-08 ENCOUNTER — Encounter: Payer: Self-pay | Admitting: Internal Medicine

## 2012-10-08 VITALS — BP 100/66 | HR 68 | Temp 98.7°F | Ht 60.75 in | Wt 122.0 lb

## 2012-10-08 DIAGNOSIS — Z0000 Encounter for general adult medical examination without abnormal findings: Secondary | ICD-10-CM

## 2012-10-08 DIAGNOSIS — R209 Unspecified disturbances of skin sensation: Secondary | ICD-10-CM

## 2012-10-08 DIAGNOSIS — R202 Paresthesia of skin: Secondary | ICD-10-CM

## 2012-10-08 DIAGNOSIS — M778 Other enthesopathies, not elsewhere classified: Secondary | ICD-10-CM

## 2012-10-08 DIAGNOSIS — M6580 Other synovitis and tenosynovitis, unspecified site: Secondary | ICD-10-CM

## 2012-10-08 DIAGNOSIS — Z23 Encounter for immunization: Secondary | ICD-10-CM

## 2012-10-08 DIAGNOSIS — D485 Neoplasm of uncertain behavior of skin: Secondary | ICD-10-CM

## 2012-10-08 DIAGNOSIS — C50919 Malignant neoplasm of unspecified site of unspecified female breast: Secondary | ICD-10-CM

## 2012-10-08 NOTE — Progress Notes (Signed)
Subjective:    Patient ID: Michelle Wagner, female    DOB: Nov 28, 1966, 46 y.o.   MRN: [id number]  HPI Patient comes in today for preventive visit and follow-up of medical issues. Update  history since  last visit: Have recently moved to a house and  Has been lifting a good bit. having right elbow discomfort  With active rom no redness or swelling hard to shift care at times. No injur.  Has had minimal fingertip and toe tingling numbness  And had orders from Dr Welton Flakes to ccheck metabolic parameters.   Check a few skin areas one on chest a bit changing   Points out change on one on face.  Review of Systems ROS:  GEN/ HEENT: No fever, significant weight changes sweats headaches vision problems hearing changes, CV/ PULM; No chest pain shortness of breath cough, syncope,edema  change in exercise tolerance. GI /GU: No adominal pain, vomiting, change in bowel habits. No blood in the stool. No significant GU symptoms. SKIN/HEME: ,no acute skin rashes or bleeding. No lymphadenopathy, masses.  NEURO/ PSYCH:   As hpi. No depression anxiety. IMM/ Allergy: No unusual infections.  Allergy .   REST of 12 system review negative except as per HPI  Past history family history social history reviewed in the electronic medical record.    Objective:   Physical Exam BP 100/66  Pulse 68  Temp 98.7 F (37.1 C) (Oral)  Ht 5' 0.75" (1.543 m)  Wt 122 lb (55.339 kg)  BMI 23.24 kg/m2  SpO2 97% Physical Exam: Vital signs reviewed JYN:WGNF is a well-developed well-nourished alert cooperative  female who appears her stated age in no acute distress.  HEENT: normocephalic atraumatic , Eyes: PERRL EOM's full, conjunctiva clear, Nares: paten,t no deformity discharge or tenderness., Ears: no deformity EAC's clear TMs with normal landmarks. Mouth: clear OP, no lesions, edema.  Moist mucous membranes. Dentition in adequate repair. NECK: supple without masses, thyromegaly or bruits. CHEST/PULM:  Clear to auscultation and  percussion breath sounds equal no wheeze , rales or rhonchi. No chest wall deformities or tenderness. Breast right : mild skin deformity right lateral non tender now nodules and axilla clear  No edema CV: PMI is nondisplaced, S1 S2 no gallops, murmurs, rubs. Peripheral pulses are full without delay.No JVD .  ABDOMEN: Bowel sounds normal nontender  No guard or rebound, no hepato splenomegal no CVA tenderness.   Extremtities:  No clubbing cyanosis or edema, no acute joint swelling or redness no focal atrophy tight elbow no effusion or swelling pain on grip no point tenderness  NEURO:  Oriented x3, cranial nerves 3-12 appear to be intact, no obvious focal weakness,gait within normal limits no abnormal reflexes or asymmetrical  Sensation tip of toes ok with 10 g monofilament but poss decreased   No atrophy or color change SKIN: No acute rashes normal turgor, color, no bruising or petechiae. Right nasal labial area with pearly colored mole with central pigment  pigmented mole 3 mm rightcheek symmetrical and uniform. Right chest irreg flat based with bumps on3-4 mm brown lesion  attaches to skin with skin crease present right upper chest.  PSYCH: Oriented, good eye contact, no obvious depression anxiety, cognition and judgment appear normal. LN: no cervical axillary inguinal adenopathy  Lab Results  Component Value Date   WBC 4.9 09/27/2012   HGB 13.9 09/27/2012   HCT 41.4 09/27/2012   PLT 188.0 09/27/2012   GLUCOSE 87 09/27/2012   CHOL 140 09/27/2012   TRIG 81.0 09/27/2012  HDL 57.90 09/27/2012   LDLCALC 66 09/27/2012   ALT 13 09/27/2012   AST 18 09/27/2012   NA 141 09/27/2012   K 4.6 09/27/2012   CL 106 09/27/2012   CREATININE 0.7 09/27/2012   BUN 13 09/27/2012   CO2 28 09/27/2012   TSH 1.92 09/27/2012   INR 0.87 03/19/2010   Vit d 33 b12 folate noted in normal range low normal.     Assessment & Plan:  Preventive Health Care Counseled regarding healthy nutrition, exercise, sleep,  injury prevention, calcium vit d and healthy weight .Flu vaccine Very peripheral tingling tips of toes not progressive poss from chemo getting better? No obv metabolic cause.  Continue  Fu if  Progressive. Right elbow  over use scenario can't quite tell if it is lateral or medial epicondylitis versus biceps tendinitis.  No alarm features discussed relative rest support ice etc.  Skin areas of concern uncertain of the chest lesion could be atypical dermatofibroma but is irregular. Some concern about the area on the right nasolabial because there is a pearly base and only a slight amount of pigment on the top. Will refer to get dermatology to evaluate.  History of breast cancer DCIS  was told to get her vitamin D level into the 100 range. She hasn't really been taking her supplement regularly and take 1000 2000 units a day.

## 2012-10-08 NOTE — Patient Instructions (Addendum)
Will arange for dermatology to see you.  Increase vit d supplementation to 1000 - 2000 iu per day . i agree that the elbow discomfort is overuse issue. Can use elbow support or strap ice in PM and alter activity for relative rest . MAY take 4-6 weeks to get better.  Will send copy of labs to Dr Rosine Beat desk top.   Preventive Care for Adults, Female A healthy lifestyle and preventive care can promote health and wellness. Preventive health guidelines for women include the following key practices.  A routine yearly physical is a good way to check with your caregiver about your health and preventive screening. It is a chance to share any concerns and updates on your health, and to receive a thorough exam.  Visit your dentist for a routine exam and preventive care every 6 months. Brush your teeth twice a day and floss once a day. Good oral hygiene prevents tooth decay and gum disease.  The frequency of eye exams is based on your age, health, family medical history, use of contact lenses, and other factors. Follow your caregiver's recommendations for frequency of eye exams.  Eat a healthy diet. Foods like vegetables, fruits, whole grains, low-fat dairy products, and lean protein foods contain the nutrients you need without too many calories. Decrease your intake of foods high in solid fats, added sugars, and salt. Eat the right amount of calories for you.Get information about a proper diet from your caregiver, if necessary.  Regular physical exercise is one of the most important things you can do for your health. Most adults should get at least 150 minutes of moderate-intensity exercise (any activity that increases your heart rate and causes you to sweat) each week. In addition, most adults need muscle-strengthening exercises on 2 or more days a week.  Maintain a healthy weight. The body mass index (BMI) is a screening tool to identify possible weight problems. It provides an estimate of body fat  based on height and weight. Your caregiver can help determine your BMI, and can help you achieve or maintain a healthy weight.For adults 20 years and older:  A BMI below 18.5 is considered underweight.  A BMI of 18.5 to 24.9 is normal.  A BMI of 25 to 29.9 is considered overweight.  A BMI of 30 and above is considered obese.  Maintain normal blood lipids and cholesterol levels by exercising and minimizing your intake of saturated fat. Eat a balanced diet with plenty of fruit and vegetables. Blood tests for lipids and cholesterol should begin at age 72 and be repeated every 5 years. If your lipid or cholesterol levels are high, you are over 50, or you are at high risk for heart disease, you may need your cholesterol levels checked more frequently.Ongoing high lipid and cholesterol levels should be treated with medicines if diet and exercise are not effective.  If you smoke, find out from your caregiver how to quit. If you do not use tobacco, do not start.  If you are pregnant, do not drink alcohol. If you are breastfeeding, be very cautious about drinking alcohol. If you are not pregnant and choose to drink alcohol, do not exceed 1 drink per day. One drink is considered to be 12 ounces (355 mL) of beer, 5 ounces (148 mL) of wine, or 1.5 ounces (44 mL) of liquor.  Avoid use of street drugs. Do not share needles with anyone. Ask for help if you need support or instructions about stopping the use of  drugs.  High blood pressure causes heart disease and increases the risk of stroke. Your blood pressure should be checked at least every 1 to 2 years. Ongoing high blood pressure should be treated with medicines if weight loss and exercise are not effective.  If you are 54 to 46 years old, ask your caregiver if you should take aspirin to prevent strokes.  Diabetes screening involves taking a blood sample to check your fasting blood sugar level. This should be done once every 3 years, after age 31, if  you are within normal weight and without risk factors for diabetes. Testing should be considered at a younger age or be carried out more frequently if you are overweight and have at least 1 risk factor for diabetes.  Breast cancer screening is essential preventive care for women. You should practice "breast self-awareness." This means understanding the normal appearance and feel of your breasts and may include breast self-examination. Any changes detected, no matter how small, should be reported to a caregiver. Women in their 28s and 30s should have a clinical breast exam (CBE) by a caregiver as part of a regular health exam every 1 to 3 years. After age 22, women should have a CBE every year. Starting at age 28, women should consider having a mammography (breast X-ray test) every year. Women who have a family history of breast cancer should talk to their caregiver about genetic screening. Women at a high risk of breast cancer should talk to their caregivers about having magnetic resonance imaging (MRI) and a mammography every year.  The Pap test is a screening test for cervical cancer. A Pap test can show cell changes on the cervix that might become cervical cancer if left untreated. A Pap test is a procedure in which cells are obtained and examined from the lower end of the uterus (cervix).  Women should have a Pap test starting at age 25.  Between ages 71 and 31, Pap tests should be repeated every 2 years.  Beginning at age 29, you should have a Pap test every 3 years as long as the past 3 Pap tests have been normal.  Some women have medical problems that increase the chance of getting cervical cancer. Talk to your caregiver about these problems. It is especially important to talk to your caregiver if a new problem develops soon after your last Pap test. In these cases, your caregiver may recommend more frequent screening and Pap tests.  The above recommendations are the same for women who have or  have not gotten the vaccine for human papillomavirus (HPV).  If you had a hysterectomy for a problem that was not cancer or a condition that could lead to cancer, then you no longer need Pap tests. Even if you no longer need a Pap test, a regular exam is a good idea to make sure no other problems are starting.  If you are between ages 76 and 79, and you have had normal Pap tests going back 10 years, you no longer need Pap tests. Even if you no longer need a Pap test, a regular exam is a good idea to make sure no other problems are starting.  If you have had past treatment for cervical cancer or a condition that could lead to cancer, you need Pap tests and screening for cancer for at least 20 years after your treatment.  If Pap tests have been discontinued, risk factors (such as a new sexual partner) need to be reassessed to  determine if screening should be resumed.  The HPV test is an additional test that may be used for cervical cancer screening. The HPV test looks for the virus that can cause the cell changes on the cervix. The cells collected during the Pap test can be tested for HPV. The HPV test could be used to screen women aged 46 years and older, and should be used in women of any age who have unclear Pap test results. After the age of 65, women should have HPV testing at the same frequency as a Pap test.  Colorectal cancer can be detected and often prevented. Most routine colorectal cancer screening begins at the age of 33 and continues through age 19. However, your caregiver may recommend screening at an earlier age if you have risk factors for colon cancer. On a yearly basis, your caregiver may provide home test kits to check for hidden blood in the stool. Use of a small camera at the end of a tube, to directly examine the colon (sigmoidoscopy or colonoscopy), can detect the earliest forms of colorectal cancer. Talk to your caregiver about this at age 25, when routine screening begins. Direct  examination of the colon should be repeated every 5 to 10 years through age 42, unless early forms of pre-cancerous polyps or small growths are found.  Hepatitis C blood testing is recommended for all people born from [birth year] through 1965 and any individual with known risks for hepatitis C.  Practice safe sex. Use condoms and avoid high-risk sexual practices to reduce the spread of sexually transmitted infections (STIs). STIs include gonorrhea, chlamydia, syphilis, trichomonas, herpes, HPV, and human immunodeficiency virus (HIV). Herpes, HIV, and HPV are viral illnesses that have no cure. They can result in disability, cancer, and death. Sexually active women aged 20 and younger should be checked for chlamydia. Older women with new or multiple partners should also be tested for chlamydia. Testing for other STIs is recommended if you are sexually active and at increased risk.  Osteoporosis is a disease in which the bones lose minerals and strength with aging. This can result in serious bone fractures. The risk of osteoporosis can be identified using a bone density scan. Women ages 77 and over and women at risk for fractures or osteoporosis should discuss screening with their caregivers. Ask your caregiver whether you should take a calcium supplement or vitamin D to reduce the rate of osteoporosis.  Menopause can be associated with physical symptoms and risks. Hormone replacement therapy is available to decrease symptoms and risks. You should talk to your caregiver about whether hormone replacement therapy is right for you.  Use sunscreen with sun protection factor (SPF) of 30 or more. Apply sunscreen liberally and repeatedly throughout the day. You should seek shade when your shadow is shorter than you. Protect yourself by wearing long sleeves, pants, a wide-brimmed hat, and sunglasses year round, whenever you are outdoors.  Once a month, do a whole body skin exam, using a mirror to look at the skin on your  back. Notify your caregiver of new moles, moles that have irregular borders, moles that are larger than a pencil eraser, or moles that have changed in shape or color.  Stay current with required immunizations.  Influenza. You need a dose every fall (or winter). The composition of the flu vaccine changes each year, so being vaccinated once is not enough.  Pneumococcal polysaccharide. You need 1 to 2 doses if you smoke cigarettes or if you have certain chronic  medical conditions. You need 1 dose at age 58 (or older) if you have never been vaccinated.  Tetanus, diphtheria, pertussis (Tdap, Td). Get 1 dose of Tdap vaccine if you are younger than age 71, are over 36 and have contact with an infant, are a Research scientist (physical sciences), are pregnant, or simply want to be protected from whooping cough. After that, you need a Td booster dose every 10 years. Consult your caregiver if you have not had at least 3 tetanus and diphtheria-containing shots sometime in your life or have a deep or dirty wound.  HPV. You need this vaccine if you are a woman age 5 or younger. The vaccine is given in 3 doses over 6 months.  Measles, mumps, rubella (MMR). You need at least 1 dose of MMR if you were born in 1957 or later. You may also need a second dose.  Meningococcal. If you are age 74 to 49 and a first-year college student living in a residence hall, or have one of several medical conditions, you need to get vaccinated against meningococcal disease. You may also need additional booster doses.  Zoster (shingles). If you are age 53 or older, you should get this vaccine.  Varicella (chickenpox). If you have never had chickenpox or you were vaccinated but received only 1 dose, talk to your caregiver to find out if you need this vaccine.  Hepatitis A. You need this vaccine if you have a specific risk factor for hepatitis A virus infection or you simply wish to be protected from this disease. The vaccine is usually given as 2 doses,  6 to 18 months apart.  Hepatitis B. You need this vaccine if you have a specific risk factor for hepatitis B virus infection or you simply wish to be protected from this disease. The vaccine is given in 3 doses, usually over 6 months. Preventive Services / Frequency Ages 8 to 69  Blood pressure check.** / Every 1 to 2 years.  Lipid and cholesterol check.** / Every 5 years beginning at age 81.  Clinical breast exam.** / Every 3 years for women in their 19s and 30s.  Pap test.** / Every 2 years from ages 34 through 2. Every 3 years starting at age 32 through age 74 or 7 with a history of 3 consecutive normal Pap tests.  HPV screening.** / Every 3 years from ages 52 through ages 64 to 58 with a history of 3 consecutive normal Pap tests.  Hepatitis C blood test.** / For any individual with known risks for hepatitis C.  Skin self-exam. / Monthly.  Influenza immunization.** / Every year.  Pneumococcal polysaccharide immunization.** / 1 to 2 doses if you smoke cigarettes or if you have certain chronic medical conditions.  Tetanus, diphtheria, pertussis (Tdap, Td) immunization. / A one-time dose of Tdap vaccine. After that, you need a Td booster dose every 10 years.  HPV immunization. / 3 doses over 6 months, if you are 55 and younger.  Measles, mumps, rubella (MMR) immunization. / You need at least 1 dose of MMR if you were born in 1957 or later. You may also need a second dose.  Meningococcal immunization. / 1 dose if you are age 70 to 14 and a first-year college student living in a residence hall, or have one of several medical conditions, you need to get vaccinated against meningococcal disease. You may also need additional booster doses.  Varicella immunization.** / Consult your caregiver.  Hepatitis A immunization.** / Consult your caregiver. 2  doses, 6 to 18 months apart.  Hepatitis B immunization.** / Consult your caregiver. 3 doses usually over 6 months. Ages 19 to  15  Blood pressure check.** / Every 1 to 2 years.  Lipid and cholesterol check.** / Every 5 years beginning at age 48.  Clinical breast exam.** / Every year after age 18.  Mammogram.** / Every year beginning at age 68 and continuing for as long as you are in good health. Consult with your caregiver.  Pap test.** / Every 3 years starting at age 30 through age 14 or 9 with a history of 3 consecutive normal Pap tests.  HPV screening.** / Every 3 years from ages 33 through ages 8 to 66 with a history of 3 consecutive normal Pap tests.  Fecal occult blood test (FOBT) of stool. / Every year beginning at age 29 and continuing until age 82. You may not need to do this test if you get a colonoscopy every 10 years.  Flexible sigmoidoscopy or colonoscopy.** / Every 5 years for a flexible sigmoidoscopy or every 10 years for a colonoscopy beginning at age 47 and continuing until age 74.  Hepatitis C blood test.** / For all people born from [birth year] through 1965 and any individual with known risks for hepatitis C.  Skin self-exam. / Monthly.  Influenza immunization.** / Every year.  Pneumococcal polysaccharide immunization.** / 1 to 2 doses if you smoke cigarettes or if you have certain chronic medical conditions.  Tetanus, diphtheria, pertussis (Tdap, Td) immunization.** / A one-time dose of Tdap vaccine. After that, you need a Td booster dose every 10 years.  Measles, mumps, rubella (MMR) immunization. / You need at least 1 dose of MMR if you were born in 1957 or later. You may also need a second dose.  Varicella immunization.** / Consult your caregiver.  Meningococcal immunization.** / Consult your caregiver.  Hepatitis A immunization.** / Consult your caregiver. 2 doses, 6 to 18 months apart.  Hepatitis B immunization.** / Consult your caregiver. 3 doses, usually over 6 months. Ages 62 and over  Blood pressure check.** / Every 1 to 2 years.  Lipid and cholesterol check.** / Every 5 years  beginning at age 62.  Clinical breast exam.** / Every year after age 95.  Mammogram.** / Every year beginning at age 49 and continuing for as long as you are in good health. Consult with your caregiver.  Pap test.** / Every 3 years starting at age 109 through age 55 or 65 with a 3 consecutive normal Pap tests. Testing can be stopped between 65 and 70 with 3 consecutive normal Pap tests and no abnormal Pap or HPV tests in the past 10 years.  HPV screening.** / Every 3 years from ages 29 through ages 5 or 73 with a history of 3 consecutive normal Pap tests. Testing can be stopped between 65 and 70 with 3 consecutive normal Pap tests and no abnormal Pap or HPV tests in the past 10 years.  Fecal occult blood test (FOBT) of stool. / Every year beginning at age 72 and continuing until age 3. You may not need to do this test if you get a colonoscopy every 10 years.  Flexible sigmoidoscopy or colonoscopy.** / Every 5 years for a flexible sigmoidoscopy or every 10 years for a colonoscopy beginning at age 30 and continuing until age 10.  Hepatitis C blood test.** / For all people born from [birth year] through 1965 and any individual with known risks for hepatitis C.  Osteoporosis  screening.** / A one-time screening for women ages 16 and over and women at risk for fractures or osteoporosis.  Skin self-exam. / Monthly.  Influenza immunization.** / Every year.  Pneumococcal polysaccharide immunization.** / 1 dose at age 41 (or older) if you have never been vaccinated.  Tetanus, diphtheria, pertussis (Tdap, Td) immunization. / A one-time dose of Tdap vaccine if you are over 65 and have contact with an infant, are a Research scientist (physical sciences), or simply want to be protected from whooping cough. After that, you need a Td booster dose every 10 years.  Varicella immunization.** / Consult your caregiver.  Meningococcal immunization.** / Consult your caregiver.  Hepatitis A immunization.** / Consult your caregiver. 2  doses, 6 to 18 months apart.  Hepatitis B immunization.** / Check with your caregiver. 3 doses, usually over 6 months. ** Family history and personal history of risk and conditions may change your caregiver's recommendations. Document Released: 01/24/2002 Document Revised: 02/20/2012 Document Reviewed: 04/25/2011 [facility] Patient Information 2013 [place], [place].

## 2012-11-02 ENCOUNTER — Other Ambulatory Visit: Payer: Self-pay | Admitting: Oncology

## 2012-11-02 DIAGNOSIS — Z853 Personal history of malignant neoplasm of breast: Secondary | ICD-10-CM

## 2012-11-02 DIAGNOSIS — Z9889 Other specified postprocedural states: Secondary | ICD-10-CM

## 2012-12-12 HISTORY — PX: BREAST LUMPECTOMY: SHX2

## 2013-01-08 ENCOUNTER — Other Ambulatory Visit: Payer: Self-pay | Admitting: Oncology

## 2013-01-08 ENCOUNTER — Ambulatory Visit
Admission: RE | Admit: 2013-01-08 | Discharge: 2013-01-08 | Disposition: A | Discharge status: Home / Self Care | Payer: BC Managed Care – PPO | Source: Ambulatory Visit | Attending: Oncology | Admitting: Oncology

## 2013-01-08 DIAGNOSIS — Z9889 Other specified postprocedural states: Secondary | ICD-10-CM

## 2013-01-08 DIAGNOSIS — R921 Mammographic calcification found on diagnostic imaging of breast: Secondary | ICD-10-CM

## 2013-01-08 DIAGNOSIS — Z853 Personal history of malignant neoplasm of breast: Secondary | ICD-10-CM

## 2013-01-09 ENCOUNTER — Other Ambulatory Visit: Payer: Self-pay | Admitting: Emergency Medicine

## 2013-01-09 ENCOUNTER — Ambulatory Visit
Admission: RE | Admit: 2013-01-09 | Discharge: 2013-01-09 | Disposition: A | Discharge status: Home / Self Care | Payer: BC Managed Care – PPO | Source: Ambulatory Visit | Attending: Oncology | Admitting: Oncology

## 2013-01-09 DIAGNOSIS — D0510 Intraductal carcinoma in situ of unspecified breast: Secondary | ICD-10-CM | POA: Insufficient documentation

## 2013-01-09 DIAGNOSIS — R921 Mammographic calcification found on diagnostic imaging of breast: Secondary | ICD-10-CM

## 2013-01-09 HISTORY — DX: Intraductal carcinoma in situ of unspecified breast: D05.10

## 2013-01-10 ENCOUNTER — Other Ambulatory Visit: Payer: Self-pay | Admitting: Oncology

## 2013-01-10 DIAGNOSIS — C50912 Malignant neoplasm of unspecified site of left female breast: Secondary | ICD-10-CM

## 2013-01-11 ENCOUNTER — Encounter (INDEPENDENT_AMBULATORY_CARE_PROVIDER_SITE_OTHER): Payer: Self-pay | Admitting: General Surgery

## 2013-01-11 ENCOUNTER — Ambulatory Visit (INDEPENDENT_AMBULATORY_CARE_PROVIDER_SITE_OTHER): Payer: BC Managed Care – PPO | Admitting: General Surgery

## 2013-01-11 ENCOUNTER — Other Ambulatory Visit: Payer: Self-pay | Admitting: Oncology

## 2013-01-11 VITALS — BP 116/62 | HR 83 | Temp 98.0°F | Resp 18 | Ht 61.0 in | Wt 120.0 lb

## 2013-01-11 DIAGNOSIS — D0590 Unspecified type of carcinoma in situ of unspecified breast: Secondary | ICD-10-CM

## 2013-01-11 DIAGNOSIS — D0510 Intraductal carcinoma in situ of unspecified breast: Secondary | ICD-10-CM

## 2013-01-11 DIAGNOSIS — C50919 Malignant neoplasm of unspecified site of unspecified female breast: Secondary | ICD-10-CM

## 2013-01-11 NOTE — Assessment & Plan Note (Signed)
Patient is currently making a decision between bilateral mastectomies with reconstruction versus left-sided lumpectomy. If she decides to have a mastectomy we will make status of the lymph node biopsy on the left. This is discussed with the patient. She is leaning that direction.  She is especially concerned as this is a recurrence. She is getting an MRI on Monday and has an appointment with Dr. Kelly Splinter on Tuesday to discuss reconstruction options.  Hopefully by Monday her receptor status will be back. We will discuss her at conference on Wednesday.  We will get her on surgical schedule as soon as possible when she sees Dr. Kelly Splinter and make a decision about what she is having.  45 min spent with patient.

## 2013-01-11 NOTE — Progress Notes (Signed)
Chief Complaint  Patient presents with  . New Evaluation    left breast    HISTORY: Patient is a 47-year-old female who presents with new left-sided DCIS. She has a history of right breast cancer treated in [place] with breast conservation therapy. I removed her Port-A-Cath back in 2011.  She has not had any issues with her breasts since 2010 until her mammogram in January. She is to have a suspicious area of microcalcifications. These were biopsied and were positive for DCIS.  She is thinking about bilateral mastectomies versus breast conservation therapy on the left. Her breast cancer was hormone negative but HER-2 positive. She did receive a year of Herceptin, but did not receive anti hormonal treatment.  She was unable to palpate any masses at this breast. She did undergo BRCA1 and 2 testing in 2010, and this was negative. She is set to see Dr. Sanger to discuss reconstruction and is leaning toward bilateral mastectomies.  Past Medical History  Diagnosis Date  . Breast cancer     right lumpectomy herpos ert neg dcis multilocal invasionN0M0 rs herceptin radiation 2011  . H/O gastroesophageal reflux (GERD)     endoscopy  . Hx of migraines     Past Surgical History  Procedure Date  . Abdominal hysterectomy 2008  . Breast lumpectomy 2010    right    Current Outpatient Prescriptions  Medication Sig Dispense Refill  . b complex vitamins capsule Take 1 capsule by mouth daily.      . Calcium Carbonate-Vitamin D (CALCIUM + D PO) Take 1 tablet by mouth daily.      . Cholecalciferol (VITAMIN D3) 1000 UNITS CAPS Take 1 capsule by mouth daily.      . Multiple Vitamin (MULTIVITAMIN) tablet Take 1 tablet by mouth daily.         No Known Allergies   Family History  Problem Relation Age of Onset  . Cancer Maternal Aunt 80    ?Uterine  . Thalassemia Cousin     several paternal cousins with Beta Thal  . Klinefelter's syndrome    . Diabetes Mellitus II    . Eating disorder Daughter    . Hyperthyroidism Mother   . Graves' disease Mother   . Diabetes Father   . Hypertension Father   . Hypertension Brother   . Hyperlipidemia Brother     one brother w/ hihg bp/ chol     History   Social History  . Marital Status: Married    Spouse Name: N/A    Number of Children: N/A  . Years of Education: N/A   Social History Main Topics  . Smoking status: Never Smoker   . Smokeless tobacco: Never Used  . Alcohol Use: No  . Drug Use: No  . Sexually Active: Yes   Other Topics Concern  . None   Social History Narrative   hh of  7  Husband children and MInLAWJust moved to a new house Exercise not when moving. Pet 1 cats. Neg tobaccoMasters level [id number]     REVIEW OF SYSTEMS - PERTINENT POSITIVES ONLY: 12 point review of systems negative other than HPI and PMH  EXAM: Filed Vitals:   01/11/13 1001  BP: 116/62  Pulse: 83  Temp: 98 F (36.7 C)  Resp: 18    Gen:  No acute distress.  Well nourished and well groomed.   Neurological: Alert and oriented to person, place, and time. Coordination normal.  Head: Normocephalic and atraumatic.  Eyes: Conjunctivae are   normal. Pupils are equal, round, and reactive to light. No scleral icterus.  Neck: Normal range of motion. Neck supple. No tracheal deviation or thyromegaly present.  Cardiovascular: Normal rate, regular rhythm, normal heart sounds and intact distal pulses.  Exam reveals no gallop and no friction rub.  No murmur heard. Breast:  Right breast with scar in upper outer quadrant.  Radiation changes to skin.  Some tethering of scar to chest wall.  Left breast sl larger.  Hematoma palpable in left breast lower outer quadrant.  No masses palpable.  No LAD or skin dimpling.   Respiratory: Effort normal.  No respiratory distress. No chest wall tenderness. Breath sounds normal.  No wheezes, rales or rhonchi.  GI: Soft. Bowel sounds are normal. The abdomen is soft and nontender.  There is no rebound and no guarding.   Musculoskeletal: Normal range of motion. Extremities are nontender.  Lymphadenopathy: No cervical, preauricular, postauricular or axillary adenopathy is present Skin: Skin is warm and dry. No rash noted. No diaphoresis. No erythema. No pallor. No clubbing, cyanosis, or edema.   Psychiatric: Normal mood and affect. Behavior is normal. Judgment and thought content normal.    LABORATORY RESULTS: Available labs are reviewed  Diagnosis Breast, left, needle core biopsy, 3 o'clock - DUCTAL CARCINOMA IN SITU WITH CALCIFICATIONS. - LOBULAR NEOPLASIS (ALH).  RADIOLOGY RESULTS: See E-Chart or I-Site for most recent results.  Images and reports are reviewed. Mammogram reviewed.     ASSESSMENT AND PLAN: DCIS (ductal carcinoma in situ) of left breast Patient is currently making a decision between bilateral mastectomies with reconstruction versus left-sided lumpectomy. If she decides to have a mastectomy we will make status of the lymph node biopsy on the left. This is discussed with the patient. She is leaning that direction.  She is especially concerned as this is a recurrence. She is getting an MRI on Monday and has an appointment with Dr. Sanger on Tuesday to discuss reconstruction options.  Hopefully by Monday her receptor status will be back. We will discuss her at conference on Wednesday.  We will get her on surgical schedule as soon as possible when she sees Dr. Sanger and make a decision about what she is having.  45 min spent with patient.       Aahana Elza L Karron Alvizo MD Surgical Oncology, General and Endocrine Surgery [facility]      Visit Diagnoses: 1. DCIS (ductal carcinoma in situ) of left breast     Primary Care Physician: PANOSH,WANDA KOTVAN, MD    

## 2013-01-11 NOTE — Patient Instructions (Signed)
Please call Michelle Wagner or have Dr. Leonie Green office let Michelle Wagner know your decision.    I will leave orders for either case.

## 2013-01-14 ENCOUNTER — Telehealth: Payer: Self-pay | Admitting: Oncology

## 2013-01-14 ENCOUNTER — Ambulatory Visit
Admission: RE | Admit: 2013-01-14 | Discharge: 2013-01-14 | Disposition: A | Discharge status: Home / Self Care | Payer: BC Managed Care – PPO | Source: Ambulatory Visit | Attending: Oncology | Admitting: Oncology

## 2013-01-14 DIAGNOSIS — C50912 Malignant neoplasm of unspecified site of left female breast: Secondary | ICD-10-CM

## 2013-01-14 MED ORDER — GADOBENATE DIMEGLUMINE 529 MG/ML IV SOLN
11.0000 mL | Freq: Once | INTRAVENOUS | Status: AC | PRN
  Administered 2013-01-14: 11 mL via INTRAVENOUS

## 2013-01-14 NOTE — Telephone Encounter (Signed)
Per 1/31 pof pt to see Dr. Shella Spearing. Dr. Kelly Splinter is @ [facility], Tiffany made aware of referral. HIM will schedule and contact pt.

## 2013-01-15 ENCOUNTER — Telehealth: Payer: Self-pay | Admitting: Oncology

## 2013-01-15 NOTE — Telephone Encounter (Signed)
Pt appt. With Dr. Wayland Denis 01/15/13@1 :30

## 2013-01-16 ENCOUNTER — Encounter: Payer: Self-pay | Admitting: *Deleted

## 2013-01-16 NOTE — Progress Notes (Signed)
Per Dr. Welton Flakes - schedule pt for genetics on 01/17/13 at 11am.  Pt is aware.

## 2013-01-17 ENCOUNTER — Other Ambulatory Visit: Payer: BC Managed Care – PPO | Admitting: Lab

## 2013-01-17 ENCOUNTER — Encounter: Payer: Self-pay | Admitting: Genetic Counselor

## 2013-01-17 ENCOUNTER — Ambulatory Visit
Admission: RE | Admit: 2013-01-17 | Discharge: 2013-01-17 | Disposition: A | Discharge status: Home / Self Care | Payer: BC Managed Care – PPO | Source: Ambulatory Visit | Attending: Radiation Oncology | Admitting: Radiation Oncology

## 2013-01-17 ENCOUNTER — Encounter: Payer: Self-pay | Admitting: Radiation Oncology

## 2013-01-17 ENCOUNTER — Ambulatory Visit (HOSPITAL_BASED_OUTPATIENT_CLINIC_OR_DEPARTMENT_OTHER): Payer: BC Managed Care – PPO | Admitting: Genetic Counselor

## 2013-01-17 ENCOUNTER — Encounter (INDEPENDENT_AMBULATORY_CARE_PROVIDER_SITE_OTHER): Payer: BC Managed Care – PPO | Admitting: General Surgery

## 2013-01-17 VITALS — BP 106/69 | HR 71 | Temp 98.1°F | Resp 20 | Wt 122.6 lb

## 2013-01-17 DIAGNOSIS — Z923 Personal history of irradiation: Secondary | ICD-10-CM | POA: Insufficient documentation

## 2013-01-17 DIAGNOSIS — C50919 Malignant neoplasm of unspecified site of unspecified female breast: Secondary | ICD-10-CM

## 2013-01-17 DIAGNOSIS — Z800 Family history of malignant neoplasm of digestive organs: Secondary | ICD-10-CM | POA: Insufficient documentation

## 2013-01-17 DIAGNOSIS — Z8719 Personal history of other diseases of the digestive system: Secondary | ICD-10-CM | POA: Insufficient documentation

## 2013-01-17 DIAGNOSIS — D0510 Intraductal carcinoma in situ of unspecified breast: Secondary | ICD-10-CM

## 2013-01-17 DIAGNOSIS — Z8669 Personal history of other diseases of the nervous system and sense organs: Secondary | ICD-10-CM | POA: Insufficient documentation

## 2013-01-17 DIAGNOSIS — Z809 Family history of malignant neoplasm, unspecified: Secondary | ICD-10-CM

## 2013-01-17 DIAGNOSIS — Z853 Personal history of malignant neoplasm of breast: Secondary | ICD-10-CM

## 2013-01-17 DIAGNOSIS — Z803 Family history of malignant neoplasm of breast: Secondary | ICD-10-CM | POA: Insufficient documentation

## 2013-01-17 DIAGNOSIS — IMO0002 Reserved for concepts with insufficient information to code with codable children: Secondary | ICD-10-CM

## 2013-01-17 DIAGNOSIS — Z8049 Family history of malignant neoplasm of other genital organs: Secondary | ICD-10-CM | POA: Insufficient documentation

## 2013-01-17 DIAGNOSIS — D0590 Unspecified type of carcinoma in situ of unspecified breast: Secondary | ICD-10-CM | POA: Insufficient documentation

## 2013-01-17 HISTORY — DX: Intraductal carcinoma in situ of unspecified breast: D05.10

## 2013-01-17 HISTORY — DX: Personal history of irradiation: Z92.3

## 2013-01-17 NOTE — Progress Notes (Signed)
Radiation Oncology         [phone]) [phone] ________________________________  Initial outpatient Consultation  Name: Michelle Wagner MRN: [id number]  Date: 01/17/2013  DOB: 1966-06-01  REFERRING PHYSICIAN: Victorino December, MD  DIAGNOSIS: The encounter diagnosis was DCIS (ductal carcinoma in situ) of breast.  HISTORY OF PRESENT ILLNESS::Michelle Wagner is a 46 y.o. female  who underwent a lumpectomy at [facility] for a multifocal invasive carcinoma of the right breast. This was ER PR negative HER-2 positive. She completed radiation for this in June of 2011. She was treated adjuvantly with chemotherapy including Taxol and Herceptin she had genetic testing at that time which was negative. She had done well from her breast standpoint until a screening mammogram detected calcifications in the left breast. He underwent a stereotactic biopsy which revealed ductal carcinoma in situ with calcifications as well as atypical lobular hyperplasia. This DCIS was estrogen receptor positive at 100% and progesterone receptor positive at 54%. An MRI of the bilateral breasts on February 3 showed post biopsy change in the left breast and postlumpectomy change in the right breast. No evidence of lymphadenopathy was noted. She has met with Dr. Welton Flakes as well as Dr. Donell Beers and is obtaining information regarding her surgical options. She is also met with Dr. Kelly Splinter from plastic surgery regarding possible mastectomy and immediate reconstruction. She asked to see me today for my opinion regarding adjuvant radiation in the management of her breast cancer. She previously been seen by my partner Dr. Dayton Scrape but has recently moved into his neighborhood and felt uncomfortable seeing him both socially and medically. Her main concerns are of heart damage due to radiation given her previous history of Herceptin.  PREVIOUS RADIATION THERAPY: Yes 50 gray to the right breast in 25 fractions followed by a boost to the tumor cavity to 10 gray in 5  fractions completed 05/18/2010  PAST MEDICAL HISTORY:  has a past medical history of Breast cancer (10/12/2009); H/O gastroesophageal reflux (GERD); migraines; DCIS (ductal carcinoma in situ) of breast (01/09/13); and History of radiation therapy (04/06/10-05/18/10).    PAST SURGICAL HISTORY: Past Surgical History  Procedure Date  . Abdominal hysterectomy 2008  . Breast lumpectomy 2010    right    FAMILY HISTORY: family history includes Breast cancer in her maternal aunt; Diabetes in her father; Diabetes Mellitus II in her cousin; Eating disorder in her daughter; Luiz Blare' disease in her mother; Hyperlipidemia in her brother; Hypertension in her brother and father; Hyperthyroidism in her mother; Klinefelter's syndrome in her cousin; Stomach cancer (age of onset:29) in her cousin; Thalassemia in her cousin; and Uterine cancer (age of onset:88) in her maternal aunt.  SOCIAL HISTORY:  reports that she has never smoked. She has never used smokeless tobacco. She reports that she does not drink alcohol or use illicit drugs.  ALLERGIES: Review of patient's allergies indicates no known allergies.  MEDICATIONS:  Current Outpatient Prescriptions  Medication Sig Dispense Refill  . b complex vitamins capsule Take 1 capsule by mouth daily.      . Calcium Carbonate-Vitamin D (CALCIUM + D PO) Take 1 tablet by mouth daily.      . Cholecalciferol (VITAMIN D3) 1000 UNITS CAPS Take 1 capsule by mouth daily.      . Multiple Vitamin (MULTIVITAMIN) tablet Take 1 tablet by mouth daily.        REVIEW OF SYSTEMS:  A 15 point review of systems is documented in the electronic medical record. This was obtained by the nursing staff.  However, I reviewed this with the patient to discuss relevant findings and make appropriate changes.  Pertinent items are noted in HPI.   PHYSICAL EXAM:  weight is 122 lb 9.6 oz (55.611 kg). Her oral temperature is 98.1 F (36.7 C). Her blood pressure is 106/69 and her pulse is 71. Her  respiration is 20.   Marland Kitchen She is a pleasant female who appears her stated age. I did not perform a breast examination today. She appears a small breasts. She is alert minus x3.  LABORATORY DATA:  Lab Results  Component Value Date   WBC 4.9 09/27/2012   HGB 13.9 09/27/2012   HCT 41.4 09/27/2012   MCV 91.5 09/27/2012   PLT 188.0 09/27/2012   Lab Results  Component Value Date   NA 141 09/27/2012   K 4.6 09/27/2012   CL 106 09/27/2012   CO2 28 09/27/2012   Lab Results  Component Value Date   ALT 13 09/27/2012   AST 18 09/27/2012   ALKPHOS 50 09/27/2012   BILITOT 0.7 09/27/2012     RADIOGRAPHY: Mr Breast Bilateral W Wo Contrast  01/15/2013  *RADIOLOGY REPORT*  Clinical Data: History of malignant lumpectomy of the right breast 2010 with radiation and chemotherapy.  New cluster of calcifications noted within the left breast on recent diagnostic mammogram which demonstrated DCIS on stereotactic biopsy. Preoperative evaluation.  BILATERAL BREAST MRI WITH AND WITHOUT CONTRAST  Technique: Multiplanar, multisequence MR images of both breasts were obtained prior to and following the intravenous administration of 11ml of Multihance.  Three dimensional images were evaluated at the independent DynaCad workstation.  Comparison:  10/16/2009.  Findings: There is marked diffuse parenchymal background enhancement associated the left breast.  The right breast parenchymal background enhancement has decreased in a diffuse fashion following her right breast radiation therapy. Post lumpectomy scarring changes are seen laterally located within the right breast.  There is a signal void related to the clip used for stereotactic core biopsy within the lateral left breast.  There is no worrisome enhancement within either breast.  Post biopsy change is noted laterally within the left breast.  There is no evidence for axillary or internal mammary adenopathy and there are no additional findings.  IMPRESSION: No areas of  worrisome enhancement within either breast.  Post biopsy changes are noted laterally within the left breast and there are post lumpectomy scarring changes noted laterally within the right breast.  RECOMMENDATION: Treatment plan  THREE-DIMENSIONAL MR IMAGE RENDERING ON INDEPENDENT WORKSTATION:  Three-dimensional MR images were rendered by post-processing of the original MR data on an independent workstation.  The three- dimensional MR images were interpreted, and findings were reported in the accompanying complete MRI report for this study.  BI-RADS CATEGORY 6:  Known biopsy-proven malignancy - appropriate action should be taken.   Original Report Authenticated By: Rolla Plate, M.D.    Mm Digital Diagnostic Bilat  01/08/2013  *RADIOLOGY REPORT*  Clinical Data:  Right lumpectomy 2010  DIGITAL DIAGNOSTIC BILATERAL MAMMOGRAM WITH CAD  Comparison: 11/23/2011, 10/25/2010  Findings:  ACR Breast Density Category 4: The breast tissue is extremely dense.  There is stable benign postsurgical change on the right. The right breast is otherwise negative.  On the left in the 3 o'clock position posteriorly, there is a new cluster of mildly pleomorphic calcifications.  Mammographic images were processed with CAD.  IMPRESSION: New suspicious cluster of left calcifications  BI-RADS CATEGORY 4:  Suspicious abnormality - biopsy should be considered.  RECOMMENDATION: The patient is scheduled  to return for stereotactic guided core needle biopsy.  I have discussed the findings and recommendations with the patient. Results were also provided in writing at the conclusion of the visit.   Original Report Authenticated By: Esperanza Heir, M.D.    Mm Breast Bx W Loc Dev 1st Lesion Image Bx Spec Stereo Guide  01/10/2013  **ADDENDUM** CREATED: 01/10/2013 11:39:41  Addendum by Dr. Christiana Pellant on 01/10/2013 at 11:40 a.m.  I spoke to the patient by telephone at her request to discuss her pathology results.  This demonstrates DCIS, which  is concordant with the biopsy appearance.  MRI will be scheduled.  Surgical consultation with Dr. Donell Beers has been arranged for 01/17/2013 09:30 am. She reports mild tenderness at the biopsy site but no other problems.  All questions were answered.  I left a message regarding these findings for Dr. Welton Flakes on 01/10/2013 at 11:40 a.m.  **END ADDENDUM** SIGNED BY: Harrel Lemon, M.D.   01/09/2013  *RADIOLOGY REPORT*  Clinical Data:  Suspicious left breast calcifications three o'clock location.  History of right lumpectomy for breast cancer 2010.  STEREOTACTIC-GUIDED VACUUM ASSISTED BIOPSY OF THE LEFT BREAST AND SPECIMEN RADIOGRAPH  Comparison: Previous exams.  I met with the patient and we discussed the procedure of stereotactic-guided biopsy, including benefits and alternatives. We discussed the high likelihood of a successful procedure. We discussed the risks of the procedure, including infection, bleeding, tissue injury, clip migration, and inadequate sampling. Informed, written consent was given.  Using sterile technique, 2% lidocaine, stereotactic guidance, and a 9 gauge vacuum assisted device, biopsy was performed of left breast calcifications in the 3 o'clock location using a lateral to medial approach.  Specimen radiograph was performed, showing calcifications in the biopsy samples.  Specimens with calcifications are identified for pathology.  At the conclusion of the procedure, a T shaped tissue marker clip was deployed into the biopsy cavity.  Follow-up 2-view mammogram confirmed clip placement at the biopsy site.  IMPRESSION:  Stereotactic-guided biopsy of left breast calcifications three o'clock location with T shaped clip placement.  No apparent complications.   Original Report Authenticated By: Christiana Pellant, M.D.       IMPRESSION: DCIS of the left breast with a history of right breast cancer  PLAN: I spoke with the patient and her husband today who is a physician. We discussed the process of  simulation the placement tattoos. We discussed the use of breath hold for cardiac sparing. We discussed the use of 3-D treatment planning in order to avoid the heart. We discussed that to breath-hold does introduce a small amount of lung into the treatment field but this is asymptomatic. We discussed that radiation would not overlap with her previous radiated field on the right breast. We discussed the mostly indolent nature of DCIS and the fact that most patients do not recur after her lumpectomy and radiation. We discussed the low local failure rates in the neighborhood of 5% or less seen in patients who undergo lumpectomy and radiation for DCIS. These rates are generally equivalent to those seen with mastectomy where the failure rates are unfortunately not completely  Zero. Her first concern is trying to keep this diagnosis away from her children as her older daughter had a very difficult time of her first breast cancer diagnosis and was very depressed and had a difficult time in school due to stress regarding her diagnosis. She would like to choose option that is the least disruptive to her and her family. I encouraged her to  think about her surgical options and choose what she felt was best for her and her family. We discussed that if she chose a lumpectomy radiation would start 2-4 weeks after her surgery. I did offer her hypofractionated treatment which has been shown to be equivalent in terms of survival and local control and invasive disease. We have begun extrapolating this to DCIS patients with good results. She will require a post lumpectomy mammogram there is any question on her specimen radiograph but not all calcifications have been removed. I spent 40 minutes  face to face with the patient and more than 50% of that time was spent in counseling and/or coordination of care.   ------------------------------------------------  Lurline Hare, MD

## 2013-01-17 NOTE — Progress Notes (Signed)
Pt denies pain, fatigue, loss of appetite. She states she still has many questions re: radiation, lumpectomy, mastectomy. Husband is w/pt today.

## 2013-01-17 NOTE — Progress Notes (Signed)
Dr. Drue Second requested a consultation for genetic counseling and risk assessment for Michelle Wagner, a 47 y.o. female, for discussion of her personal history of breast cancer and family history of uterine, breast and stomach cancer. She presents to clinic today to discuss the possibility of a genetic predisposition to cancer, and to further clarify her risks, as well as her family members' risks for cancer.   HISTORY OF PRESENT ILLNESS: In 2010 and again in 2014, at the ages of 75 and 15, Michelle Wagner was diagnosed with breast cancer. This was treated with lumpectomy, chemotherapy and radiation in 2010.    Past Medical History  Diagnosis Date  . Breast cancer 10/12/2009    right lumpectomy herpos ert neg dcis multilocal invasionN0M0 rs herceptin radiation 2011  . H/O gastroesophageal reflux (GERD)     endoscopy  . Hx of migraines   . DCIS (ductal carcinoma in situ) of breast 01/09/13    left   . History of radiation therapy 04/06/10-05/18/10    right breast    Past Surgical History  Procedure Date  . Abdominal hysterectomy 2008  . Breast lumpectomy 2010    right    History  Substance Use Topics  . Smoking status: Never Smoker   . Smokeless tobacco: Never Used  . Alcohol Use: No    REPRODUCTIVE HISTORY AND PERSONAL RISK ASSESSMENT FACTORS: Uterus Intact: Yes Ovaries Intact: Yes G4P4A0 , f  She has not previously undergone treatment for infertility.     FAMILY HISTORY:  We obtained a detailed, 4-generation family history.  Significant diagnoses are listed below: Family History  Problem Relation Age of Onset  . Uterine cancer Maternal Aunt 88  . Thalassemia Cousin     several paternal cousins with Beta Thal  . Eating disorder Daughter   . Hyperthyroidism Mother   . Graves' disease Mother   . Diabetes Father   . Hypertension Father   . Hypertension Brother   . Hyperlipidemia Brother     one brother w/ hihg bp/ chol  . Breast cancer Maternal Aunt     single  mastectomy, no chemo; dx in her 58s  . Stomach cancer Cousin 29    Klinefelter syndrome  . Klinefelter's syndrome Cousin   . Diabetes Mellitus II Cousin   The patient was diagnosed with invasive ductal carcinoma at age 44 and DCIS at 76.  She has one sister and two brothers who are healthy.  Her parents are alive and cancer free.  The patient has two maternal aunts, one had uterine cancer at age 63 and the other had breast cancer in her 79s.  The patient has a paternal cousin who's son had Klinefelter syndrome and died at age 46 with stomach cancer.  There are several paternal relatives with beta thalasemia.  Patient's maternal ancestors are of [place] descent, and paternal ancestors are of [place] descent. There is no reported Ashkenazi Jewish ancestry. There is no  known consanguinity.  GENETIC COUNSELING RISK ASSESSMENT, DISCUSSION, AND SUGGESTED FOLLOW UP: We reviewed the natural history and genetic etiology of sporadic, familial and hereditary cancer syndromes. About 5-10% of breast cancer is hereditary.  Of this, about 85% is the result of a BRCA1 or BRCA2 mutation.  The patient was negative for BRCA1/2 sequencing, but never had BART testing.  We reviewed the red flags of hereditary cancer syndromes and the dominant inheritance patterns.  If the BRCA testing is negative, we discussed that we could be testing for the wrong gene.  We discussed gene panels, and that several cancer genes that are associated with different cancers can be tested at the same time.  Because of the different types of cancer that are in the patient's family, we will consider one of the panel tests if she is negative for BRCA mutations.   The patient's personal history of breast cancer is suggestive of the following possible diagnosis: hereditary breast cancer vs. sproadic  We discussed that identification of a hereditary cancer syndrome may help her care providers tailor the patients medical management. If a mutation  indicating a hereditary cancer syndrome is detected in this case, the [employer] recommendations would include increased cancer surveillance and possible prophylactic surgery. If a mutation is detected, the patient will be referred back to the referring provider and to any additional appropriate care providers to discuss the relevant options.   If a mutation is not found in the patient, this will decrease the likelihood of a hereditary cancer syndrome as the explanation for her breast cancer. Cancer surveillance options would be discussed for the patient according to the appropriate standard National Comprehensive Cancer Network and American Cancer Society guidelines, with consideration of their personal and family history risk factors. In this case, the patient will be referred back to their care providers for discussions of management.   After considering the risks, benefits, and limitations, the patient provided informed consent for  the following  testing: Breast/Ovarian Cancer Panel through [facility].   Per the patient's request, we will contact her by telephone to discuss these results. A follow up genetic counseling visit will be scheduled if indicated.  The patient was seen for a total of 30 minutes, greater than 50% of which was spent face-to-face counseling.  This plan is being carried out per Dr. Feliz Beam recommendations.  This note will also be sent to the referring provider via the electronic medical record. The patient will be supplied with a summary of this genetic counseling discussion as well as educational information on the discussed hereditary cancer syndromes following the conclusion of their visit.   Patient was discussed with Dr. Drue Second.   _______________________________________________________________________ For Office Staff:  Number of people involved in session: 2 Was an Intern/ student involved with case: no

## 2013-01-17 NOTE — Progress Notes (Signed)
Please see the Nurse Progress Note in the MD Initial Consult Encounter for this patient. 

## 2013-01-18 ENCOUNTER — Other Ambulatory Visit (INDEPENDENT_AMBULATORY_CARE_PROVIDER_SITE_OTHER): Payer: Self-pay | Admitting: General Surgery

## 2013-01-18 NOTE — Addendum Note (Signed)
Encounter addended by: Agustus Mane Mintz Mavis Fichera, RN on: 01/18/2013  5:53 PM<BR>     Documentation filed: Charges VN

## 2013-01-21 ENCOUNTER — Ambulatory Visit: Payer: BC Managed Care – PPO | Admitting: Oncology

## 2013-01-21 ENCOUNTER — Ambulatory Visit (INDEPENDENT_AMBULATORY_CARE_PROVIDER_SITE_OTHER): Payer: BC Managed Care – PPO | Admitting: Family Medicine

## 2013-01-21 ENCOUNTER — Ambulatory Visit (INDEPENDENT_AMBULATORY_CARE_PROVIDER_SITE_OTHER): Payer: Self-pay | Admitting: General Surgery

## 2013-01-21 DIAGNOSIS — Z111 Encounter for screening for respiratory tuberculosis: Secondary | ICD-10-CM

## 2013-01-22 ENCOUNTER — Ambulatory Visit: Payer: BC Managed Care – PPO | Admitting: Family Medicine

## 2013-01-22 ENCOUNTER — Other Ambulatory Visit (INDEPENDENT_AMBULATORY_CARE_PROVIDER_SITE_OTHER): Payer: Self-pay | Admitting: General Surgery

## 2013-01-22 DIAGNOSIS — D0512 Intraductal carcinoma in situ of left breast: Secondary | ICD-10-CM

## 2013-01-28 ENCOUNTER — Ambulatory Visit (INDEPENDENT_AMBULATORY_CARE_PROVIDER_SITE_OTHER): Payer: BC Managed Care – PPO | Admitting: Family Medicine

## 2013-01-28 DIAGNOSIS — Z111 Encounter for screening for respiratory tuberculosis: Secondary | ICD-10-CM

## 2013-01-29 ENCOUNTER — Encounter (HOSPITAL_COMMUNITY): Payer: Self-pay | Admitting: Pharmacy Technician

## 2013-01-30 LAB — TB SKIN TEST: TB Skin Test: NEGATIVE

## 2013-02-01 ENCOUNTER — Encounter (HOSPITAL_COMMUNITY): Payer: Self-pay

## 2013-02-01 ENCOUNTER — Encounter (HOSPITAL_COMMUNITY)
Admission: RE | Admit: 2013-02-01 | Discharge: 2013-02-01 | Disposition: A | Discharge status: Home / Self Care | Payer: BC Managed Care – PPO | Source: Ambulatory Visit | Attending: General Surgery | Admitting: General Surgery

## 2013-02-01 HISTORY — DX: Gastro-esophageal reflux disease without esophagitis: K21.9

## 2013-02-01 HISTORY — DX: Headache: R51

## 2013-02-01 HISTORY — DX: Other specified postprocedural states: R11.2

## 2013-02-01 HISTORY — DX: Other specified postprocedural states: Z98.890

## 2013-02-01 LAB — CBC
MCH: 30.9 pg (ref 26.0–34.0)
MCHC: 36.1 g/dL — ABNORMAL HIGH (ref 30.0–36.0)
MCV: 85.7 fL (ref 78.0–100.0)
Platelets: 206 10*3/uL (ref 150–400)
RDW: 12 % (ref 11.5–15.5)

## 2013-02-01 LAB — SURGICAL PCR SCREEN: MRSA, PCR: NEGATIVE

## 2013-02-01 NOTE — Pre-Procedure Instructions (Signed)
Michelle Wagner  02/01/2013   Your procedure is scheduled on:  Wednesday, February 26th  Report to [facility] at 0915 AM or when finished at breast center for needle localization.  Call this number if you have problems the morning of surgery: [phone]   Remember:   Do not eat food or drink liquids after midnight.    Take these medicines the morning of surgery with A SIP OF WATER: tylenol if needed   Do not wear jewelry, make-up or nail polish.  Do not wear lotions, powders, or perfumes,deodorant.  Do not shave 48 hours prior to surgery. Men may shave face and neck.  Do not bring valuables to the hospital.  Contacts, dentures or bridgework may not be worn into surgery.  Leave suitcase in the car. After surgery it may be brought to your room.  For patients admitted to the hospital, checkout time is 11:00 AM the day of discharge.   Patients discharged the day of surgery will not be allowed to drive home.    Special Instructions: Shower using CHG 2 nights before surgery and the night before surgery.  If you shower the day of surgery use CHG.  Use special wash - you have one bottle of CHG for all showers.  You should use approximately 1/3 of the bottle for each shower.   Please read over the following fact sheets that you were given: Pain Booklet, Coughing and Deep Breathing, MRSA Information and Surgical Site Infection Prevention

## 2013-02-01 NOTE — Progress Notes (Signed)
Primary Physician - Dr. Ann Maki Oncologist - Dr. Welton Flakes Does not have a cardiologist - No cardiac testing

## 2013-02-05 MED ORDER — CEFAZOLIN SODIUM-DEXTROSE 2-3 GM-% IV SOLR
2.0000 g | INTRAVENOUS | Status: AC
  Administered 2013-02-06: 2 g via INTRAVENOUS
  Filled 2013-02-05: qty 50

## 2013-02-06 ENCOUNTER — Ambulatory Visit (HOSPITAL_COMMUNITY): Payer: BC Managed Care – PPO | Admitting: Certified Registered Nurse Anesthetist

## 2013-02-06 ENCOUNTER — Ambulatory Visit (HOSPITAL_COMMUNITY)
Admission: RE | Admit: 2013-02-06 | Discharge: 2013-02-06 | Disposition: A | Discharge status: Home / Self Care | Payer: BC Managed Care – PPO | Source: Ambulatory Visit | Attending: General Surgery | Admitting: General Surgery

## 2013-02-06 ENCOUNTER — Encounter (HOSPITAL_COMMUNITY): Payer: Self-pay | Admitting: Certified Registered Nurse Anesthetist

## 2013-02-06 ENCOUNTER — Encounter (HOSPITAL_COMMUNITY): Payer: Self-pay | Admitting: Surgery

## 2013-02-06 ENCOUNTER — Ambulatory Visit
Admission: RE | Admit: 2013-02-06 | Discharge: 2013-02-06 | Disposition: A | Discharge status: Home / Self Care | Payer: BC Managed Care – PPO | Source: Ambulatory Visit | Attending: General Surgery | Admitting: General Surgery

## 2013-02-06 ENCOUNTER — Encounter (HOSPITAL_COMMUNITY)
Admission: RE | Disposition: A | Discharge status: Home / Self Care | Payer: Self-pay | Source: Ambulatory Visit | Attending: General Surgery

## 2013-02-06 DIAGNOSIS — K219 Gastro-esophageal reflux disease without esophagitis: Secondary | ICD-10-CM | POA: Insufficient documentation

## 2013-02-06 DIAGNOSIS — Z01812 Encounter for preprocedural laboratory examination: Secondary | ICD-10-CM | POA: Insufficient documentation

## 2013-02-06 DIAGNOSIS — D0590 Unspecified type of carcinoma in situ of unspecified breast: Secondary | ICD-10-CM

## 2013-02-06 DIAGNOSIS — G43909 Migraine, unspecified, not intractable, without status migrainosus: Secondary | ICD-10-CM | POA: Insufficient documentation

## 2013-02-06 DIAGNOSIS — Z79899 Other long term (current) drug therapy: Secondary | ICD-10-CM | POA: Insufficient documentation

## 2013-02-06 HISTORY — PX: BREAST LUMPECTOMY WITH NEEDLE LOCALIZATION: SHX5759

## 2013-02-06 SURGERY — BREAST LUMPECTOMY WITH NEEDLE LOCALIZATION
Anesthesia: General | Site: Breast | Laterality: Left | Wound class: Clean

## 2013-02-06 MED ORDER — ONDANSETRON HCL 4 MG/2ML IJ SOLN
4.0000 mg | Freq: Four times a day (QID) | INTRAMUSCULAR | Status: DC | PRN
  Administered 2013-02-06: 4 mg via INTRAVENOUS

## 2013-02-06 MED ORDER — DEXAMETHASONE SODIUM PHOSPHATE 4 MG/ML IJ SOLN
INTRAMUSCULAR | Status: DC | PRN
  Administered 2013-02-06: 4 mg via INTRAVENOUS

## 2013-02-06 MED ORDER — ONDANSETRON HCL 4 MG/2ML IJ SOLN
INTRAMUSCULAR | Status: AC
  Filled 2013-02-06: qty 2

## 2013-02-06 MED ORDER — HYDROCODONE-ACETAMINOPHEN 5-325 MG PO TABS: 1.0000 | ORAL_TABLET | Freq: Four times a day (QID) | ORAL | Status: DC | PRN

## 2013-02-06 MED ORDER — PROPOFOL 10 MG/ML IV BOLUS
INTRAVENOUS | Status: DC | PRN
  Administered 2013-02-06: 200 mg via INTRAVENOUS

## 2013-02-06 MED ORDER — LACTATED RINGERS IV SOLN
INTRAVENOUS | Status: DC | PRN
  Administered 2013-02-06: 12:00:00 via INTRAVENOUS

## 2013-02-06 MED ORDER — HYDROMORPHONE HCL PF 1 MG/ML IJ SOLN
INTRAMUSCULAR | Status: AC
  Filled 2013-02-06: qty 1

## 2013-02-06 MED ORDER — SODIUM CHLORIDE 0.9 % IJ SOLN: 3.0000 mL | Freq: Two times a day (BID) | INTRAMUSCULAR | Status: DC

## 2013-02-06 MED ORDER — ACETAMINOPHEN 325 MG PO TABS: 650.0000 mg | ORAL_TABLET | ORAL | Status: DC | PRN

## 2013-02-06 MED ORDER — LIDOCAINE HCL (PF) 1 % IJ SOLN
INTRAMUSCULAR | Status: AC
  Filled 2013-02-06: qty 30

## 2013-02-06 MED ORDER — LIDOCAINE HCL 1 % IJ SOLN
INTRAMUSCULAR | Status: DC | PRN
  Administered 2013-02-06: 13:00:00

## 2013-02-06 MED ORDER — LIDOCAINE HCL (CARDIAC) 10 MG/ML IV SOLN
INTRAVENOUS | Status: DC | PRN
  Administered 2013-02-06: 60 mg via INTRAVENOUS

## 2013-02-06 MED ORDER — SODIUM CHLORIDE 0.9 % IV SOLN: 250.0000 mL | INTRAVENOUS | Status: DC | PRN

## 2013-02-06 MED ORDER — HYDROMORPHONE HCL PF 1 MG/ML IJ SOLN
0.2500 mg | INTRAMUSCULAR | Status: DC | PRN
  Administered 2013-02-06 (×3): 0.5 mg via INTRAVENOUS

## 2013-02-06 MED ORDER — LACTATED RINGERS IV SOLN: INTRAVENOUS | Status: DC

## 2013-02-06 MED ORDER — CEFAZOLIN SODIUM-DEXTROSE 2-3 GM-% IV SOLR
INTRAVENOUS | Status: AC
  Filled 2013-02-06: qty 50

## 2013-02-06 MED ORDER — SODIUM CHLORIDE 0.9 % IJ SOLN
INTRAMUSCULAR | Status: AC
  Filled 2013-02-06: qty 3

## 2013-02-06 MED ORDER — ACETAMINOPHEN 650 MG RE SUPP: 650.0000 mg | RECTAL | Status: DC | PRN

## 2013-02-06 MED ORDER — MIDAZOLAM HCL 5 MG/5ML IJ SOLN
INTRAMUSCULAR | Status: DC | PRN
  Administered 2013-02-06: 2 mg via INTRAVENOUS

## 2013-02-06 MED ORDER — HYDROCODONE-ACETAMINOPHEN 5-325 MG PO TABS
1.0000 | ORAL_TABLET | ORAL | Status: DC | PRN
  Administered 2013-02-06: 1 via ORAL
  Filled 2013-02-06: qty 1
  Filled 2013-02-06: qty 2

## 2013-02-06 MED ORDER — FENTANYL CITRATE 0.05 MG/ML IJ SOLN
INTRAMUSCULAR | Status: DC | PRN
  Administered 2013-02-06: 100 ug via INTRAVENOUS
  Administered 2013-02-06 (×2): 50 ug via INTRAVENOUS

## 2013-02-06 MED ORDER — SODIUM CHLORIDE 0.9 % IJ SOLN: 3.0000 mL | INTRAMUSCULAR | Status: DC | PRN

## 2013-02-06 SURGICAL SUPPLY — 54 items
APPLIER CLIP 13 LRG OPEN (CLIP) ×2
BINDER BREAST LRG (GAUZE/BANDAGES/DRESSINGS) ×4 IMPLANT
BINDER BREAST XLRG (GAUZE/BANDAGES/DRESSINGS) IMPLANT
BLADE SURG 10 STRL SS (BLADE) ×2 IMPLANT
BLADE SURG 15 STRL LF DISP TIS (BLADE) ×1 IMPLANT
BLADE SURG 15 STRL SS (BLADE) ×1
BNDG COHESIVE 4X5 TAN STRL (GAUZE/BANDAGES/DRESSINGS) IMPLANT
CANISTER SUCTION 2500CC (MISCELLANEOUS) IMPLANT
CHLORAPREP W/TINT 26ML (MISCELLANEOUS) ×2 IMPLANT
CLIP APPLIE 13 LRG OPEN (CLIP) ×1 IMPLANT
CLIP TI LARGE 6 (CLIP) IMPLANT
CLIP TI MEDIUM 24 (CLIP) IMPLANT
CLIP TI WIDE RED SMALL 24 (CLIP) IMPLANT
CLOTH BEACON ORANGE TIMEOUT ST (SAFETY) ×2 IMPLANT
CONT SPEC STER OR (MISCELLANEOUS) ×2 IMPLANT
COVER PROBE W GEL 5X96 (DRAPES) ×2 IMPLANT
COVER SURGICAL LIGHT HANDLE (MISCELLANEOUS) ×2 IMPLANT
DECANTER SPIKE VIAL GLASS SM (MISCELLANEOUS) ×2 IMPLANT
DEVICE DUBIN SPECIMEN MAMMOGRA (MISCELLANEOUS) ×2 IMPLANT
DRAPE UTILITY 15X26 W/TAPE STR (DRAPE) ×4 IMPLANT
DRSG PAD ABDOMINAL 8X10 ST (GAUZE/BANDAGES/DRESSINGS) ×2 IMPLANT
ELECT CAUTERY BLADE 6.4 (BLADE) ×2 IMPLANT
ELECT REM PT RETURN 9FT ADLT (ELECTROSURGICAL) ×2
ELECTRODE REM PT RTRN 9FT ADLT (ELECTROSURGICAL) ×1 IMPLANT
GLOVE BIO SURGEON STRL SZ 6 (GLOVE) ×2 IMPLANT
GLOVE BIOGEL PI IND STRL 6.5 (GLOVE) ×1 IMPLANT
GLOVE BIOGEL PI INDICATOR 6.5 (GLOVE) ×1
GOWN PREVENTION PLUS XXLARGE (GOWN DISPOSABLE) ×2 IMPLANT
GOWN STRL NON-REIN LRG LVL3 (GOWN DISPOSABLE) ×2 IMPLANT
KIT BASIN OR (CUSTOM PROCEDURE TRAY) ×2 IMPLANT
KIT MARKER MARGIN INK (KITS) ×2 IMPLANT
KIT ROOM TURNOVER OR (KITS) ×2 IMPLANT
NEEDLE 18GX1X1/2 (RX/OR ONLY) (NEEDLE) ×2 IMPLANT
NEEDLE HYPO 25GX1X1/2 BEV (NEEDLE) ×4 IMPLANT
NS IRRIG 1000ML POUR BTL (IV SOLUTION) ×2 IMPLANT
PACK SURGICAL SETUP 50X90 (CUSTOM PROCEDURE TRAY) ×2 IMPLANT
PACK UNIVERSAL I (CUSTOM PROCEDURE TRAY) ×2 IMPLANT
PAD ARMBOARD 7.5X6 YLW CONV (MISCELLANEOUS) ×2 IMPLANT
PENCIL BUTTON HOLSTER BLD 10FT (ELECTRODE) ×2 IMPLANT
SPONGE GAUZE 4X4 12PLY (GAUZE/BANDAGES/DRESSINGS) ×2 IMPLANT
SPONGE LAP 18X18 X RAY DECT (DISPOSABLE) ×2 IMPLANT
STOCKINETTE IMPERVIOUS 9X36 MD (GAUZE/BANDAGES/DRESSINGS) IMPLANT
STRIP CLOSURE SKIN 1/2X4 (GAUZE/BANDAGES/DRESSINGS) ×2 IMPLANT
SUT MNCRL AB 4-0 PS2 18 (SUTURE) ×2 IMPLANT
SUT SILK 2 0 SH (SUTURE) ×2 IMPLANT
SUT VIC AB 2-0 SH 27 (SUTURE) ×1
SUT VIC AB 2-0 SH 27XBRD (SUTURE) ×1 IMPLANT
SUT VIC AB 3-0 SH 27 (SUTURE) ×1
SUT VIC AB 3-0 SH 27X BRD (SUTURE) ×1 IMPLANT
SYR CONTROL 10ML LL (SYRINGE) ×4 IMPLANT
TOWEL OR 17X24 6PK STRL BLUE (TOWEL DISPOSABLE) ×2 IMPLANT
TOWEL OR 17X26 10 PK STRL BLUE (TOWEL DISPOSABLE) ×2 IMPLANT
TUBE CONNECTING 12X1/4 (SUCTIONS) ×2 IMPLANT
YANKAUER SUCT BULB TIP NO VENT (SUCTIONS) ×2 IMPLANT

## 2013-02-06 NOTE — Op Note (Signed)
Left Needle Localized Breast Lumpectomy Procedure Note  Indications: This patient presents with history of a left breast DCIS. Given the clinical history and physical exam, along with indicated diagnostic studies, breast biopsy will be performed.  Pre-operative Diagnosis: left breast DCIS  Post-operative Diagnosis:  left breast DCIS  Surgeon: Almond Lint   Anesthesia: General LMA anesthesia and Local anesthesia 0.25.% bupivacaine, with epinephrine  ASA Class: 2  Procedure Details  The patient was seen in the Holding Room. The risks, benefits, complications, treatment options, and expected outcomes were discussed with the patient. The possibilities of reaction to medication, pulmonary aspiration, bleeding, infection, the need for additional procedures, failure to diagnose a condition, and creating a complication requiring transfusion or operation were discussed with the patient. The patient concurred with the proposed plan, giving informed consent. The site of surgery properly noted/marked. The patient was taken to Operating Room, identified as Arnetha Courser, and the procedure verified as lumpectomy. A Time Out was held and the above information confirmed.  After induction of anesthesia, the left breast and chest were prepped and draped in standard fashion. The lumpectomy was performed by creating an oblique incision over the upper outer quadrant of the breast. Skin flaps were created with the skin hooks and the cautery.  The wire was pulled under the skin.  Allis clamps were used to elevate the specimen around the wire.  The curved Mayo scissors and the cautery were used to remove the tissue.  The specimen was marked with the paint kit.  Additional tissue was taken on the medial margin and the superior margin.  The pectoralis was posterior margin, and skin was lateral margin.  Orientation sutures were placed.   Clips were placed along cavity.  Hemostasis was achieved with cautery. The wound was  irrigated and closed with  3-0 Vicryl deep dermal interrupted sutures and 4-0 Monocryl subcuticular closure in layers.    Sterile dressings were applied. At the end of the operation, all sponge, instrument, and needle counts were correct.  Findings: grossly clear surgical margins  Estimated Blood Loss:  Minimal         Specimens: Left breast lumpectomy, additional margins as above.          Complications:  None; patient tolerated the procedure well.         Disposition: PACU - hemodynamically stable.         Condition: Stable

## 2013-02-06 NOTE — Anesthesia Postprocedure Evaluation (Signed)
  Anesthesia Post-op Note  Patient: Michelle Wagner  Procedure(s) Performed: Procedure(s) with comments: BREAST LUMPECTOMY WITH NEEDLE LOCALIZATION (Left) - left breast needle localization lumpectomy  Patient Location: PACU  Anesthesia Type:General  Level of Consciousness: awake  Airway and Oxygen Therapy: Patient Spontanous Breathing  Post-op Pain: mild  Post-op Assessment: Post-op Vital signs reviewed  Post-op Vital Signs: Reviewed  Complications: No apparent anesthesia complications

## 2013-02-06 NOTE — Progress Notes (Signed)
Pt states nausea and pain better since zofran and vicodin will continue to monitor

## 2013-02-06 NOTE — Interval H&P Note (Signed)
History and Physical Interval Note:  02/06/2013 11:35 AM  Michelle Wagner  has presented today for surgery, with the diagnosis of left DCIS  The various methods of treatment have been discussed with the patient and family. After consideration of risks, benefits and other options for treatment, the patient has consented to  Procedure(s) with comments: BREAST LUMPECTOMY WITH NEEDLE LOCALIZATION (Left) - left breast needle localization lumpectomy as a surgical intervention .  The patient's history has been reviewed, patient examined, no change in status, stable for surgery.  I have reviewed the patient's chart and labs.  Questions were answered to the patient's satisfaction.     Cyriah Childrey

## 2013-02-06 NOTE — H&P (View-Only) (Signed)
Chief Complaint  Patient presents with  . New Evaluation    left breast    HISTORY: Patient is a 47 year old female who presents with new left-sided DCIS. She has a history of right breast cancer treated in [place] with breast conservation therapy. I removed her Port-A-Cath back in 2011.  She has not had any issues with her breasts since 2010 until her mammogram in January. She is to have a suspicious area of microcalcifications. These were biopsied and were positive for DCIS.  She is thinking about bilateral mastectomies versus breast conservation therapy on the left. Her breast cancer was hormone negative but HER-2 positive. She did receive a year of Herceptin, but did not receive anti hormonal treatment.  She was unable to palpate any masses at this breast. She did undergo BRCA1 and 2 testing in 2010, and this was negative. She is set to see Dr. Kelly Splinter to discuss reconstruction and is leaning toward bilateral mastectomies.  Past Medical History  Diagnosis Date  . Breast cancer     right lumpectomy herpos ert neg dcis multilocal invasionN0M0 rs herceptin radiation 2011  . H/O gastroesophageal reflux (GERD)     endoscopy  . Hx of migraines     Past Surgical History  Procedure Date  . Abdominal hysterectomy 2008  . Breast lumpectomy 2010    right    Current Outpatient Prescriptions  Medication Sig Dispense Refill  . b complex vitamins capsule Take 1 capsule by mouth daily.      . Calcium Carbonate-Vitamin D (CALCIUM + D PO) Take 1 tablet by mouth daily.      . Cholecalciferol (VITAMIN D3) 1000 UNITS CAPS Take 1 capsule by mouth daily.      . Multiple Vitamin (MULTIVITAMIN) tablet Take 1 tablet by mouth daily.         No Known Allergies   Family History  Problem Relation Age of Onset  . Cancer Maternal Aunt 80    ?Uterine  . Thalassemia Cousin     several paternal cousins with Beta Thal  . Klinefelter's syndrome    . Diabetes Mellitus II    . Eating disorder Daughter    . Hyperthyroidism Mother   . Graves' disease Mother   . Diabetes Father   . Hypertension Father   . Hypertension Brother   . Hyperlipidemia Brother     one brother w/ hihg bp/ chol     History   Social History  . Marital Status: Married    Spouse Name: N/A    Number of Children: N/A  . Years of Education: N/A   Social History Main Topics  . Smoking status: Never Smoker   . Smokeless tobacco: Never Used  . Alcohol Use: No  . Drug Use: No  . Sexually Active: Yes   Other Topics Concern  . None   Social History Narrative   hh of  7  Husband children and MInLAWJust moved to a new house Exercise not when moving. Pet 1 cats. Neg tobaccoMasters level educationG4P4     REVIEW OF SYSTEMS - PERTINENT POSITIVES ONLY: 12 point review of systems negative other than HPI and PMH  EXAM: Filed Vitals:   01/11/13 1001  BP: 116/62  Pulse: 83  Temp: 98 F (36.7 C)  Resp: 18    Gen:  No acute distress.  Well nourished and well groomed.   Neurological: Alert and oriented to person, place, and time. Coordination normal.  Head: Normocephalic and atraumatic.  Eyes: Conjunctivae are  normal. Pupils are equal, round, and reactive to light. No scleral icterus.  Neck: Normal range of motion. Neck supple. No tracheal deviation or thyromegaly present.  Cardiovascular: Normal rate, regular rhythm, normal heart sounds and intact distal pulses.  Exam reveals no gallop and no friction rub.  No murmur heard. Breast:  Right breast with scar in upper outer quadrant.  Radiation changes to skin.  Some tethering of scar to chest wall.  Left breast sl larger.  Hematoma palpable in left breast lower outer quadrant.  No masses palpable.  No LAD or skin dimpling.   Respiratory: Effort normal.  No respiratory distress. No chest wall tenderness. Breath sounds normal.  No wheezes, rales or rhonchi.  GI: Soft. Bowel sounds are normal. The abdomen is soft and nontender.  There is no rebound and no guarding.   Musculoskeletal: Normal range of motion. Extremities are nontender.  Lymphadenopathy: No cervical, preauricular, postauricular or axillary adenopathy is present Skin: Skin is warm and dry. No rash noted. No diaphoresis. No erythema. No pallor. No clubbing, cyanosis, or edema.   Psychiatric: Normal mood and affect. Behavior is normal. Judgment and thought content normal.    LABORATORY RESULTS: Available labs are reviewed  Diagnosis Breast, left, needle core biopsy, 3 o'clock - DUCTAL CARCINOMA IN SITU WITH CALCIFICATIONS. - LOBULAR NEOPLASIS (ALH).  RADIOLOGY RESULTS: See E-Chart or I-Site for most recent results.  Images and reports are reviewed. Mammogram reviewed.     ASSESSMENT AND PLAN: DCIS (ductal carcinoma in situ) of left breast Patient is currently making a decision between bilateral mastectomies with reconstruction versus left-sided lumpectomy. If she decides to have a mastectomy we will make status of the lymph node biopsy on the left. This is discussed with the patient. She is leaning that direction.  She is especially concerned as this is a recurrence. She is getting an MRI on Monday and has an appointment with Dr. Kelly Splinter on Tuesday to discuss reconstruction options.  Hopefully by Monday her receptor status will be back. We will discuss her at conference on Wednesday.  We will get her on surgical schedule as soon as possible when she sees Dr. Kelly Splinter and make a decision about what she is having.  45 min spent with patient.       Maudry Diego MD Surgical Oncology, General and Endocrine Surgery [facility]      Visit Diagnoses: 1. DCIS (ductal carcinoma in situ) of left breast     Primary Care Physician: [name], MD

## 2013-02-06 NOTE — Transfer of Care (Signed)
Immediate Anesthesia Transfer of Care Note  Patient: Michelle Wagner  Procedure(s) Performed: Procedure(s) with comments: BREAST LUMPECTOMY WITH NEEDLE LOCALIZATION (Left) - left breast needle localization lumpectomy  Patient Location: PACU  Anesthesia Type:General  Level of Consciousness: awake and alert   Airway & Oxygen Therapy: Patient Spontanous Breathing and Patient connected to nasal cannula oxygen  Post-op Assessment: Report given to PACU RN, Post -op Vital signs reviewed and stable and Patient moving all extremities  Post vital signs: Reviewed and stable  Complications: No apparent anesthesia complications

## 2013-02-06 NOTE — Anesthesia Preprocedure Evaluation (Signed)
Anesthesia Evaluation  Patient identified by MRN, date of birth, ID band Patient awake    Reviewed: Allergy & Precautions, H&P , NPO status , Patient's Chart, lab work & pertinent test results  Airway Mallampati: II      Dental   Pulmonary neg pulmonary ROS,  breath sounds clear to auscultation        Cardiovascular negative cardio ROS  Rhythm:Regular Rate:Normal     Neuro/Psych  Headaches,    GI/Hepatic Neg liver ROS, GERD-  ,  Endo/Other  negative endocrine ROS  Renal/GU negative Renal ROS     Musculoskeletal   Abdominal   Peds  Hematology   Anesthesia Other Findings   Reproductive/Obstetrics                           Anesthesia Physical Anesthesia Plan  ASA: II  Anesthesia Plan: General   Post-op Pain Management:    Induction: Intravenous  Airway Management Planned: LMA  Additional Equipment:   Intra-op Plan:   Post-operative Plan: Extubation in OR  Informed Consent: I have reviewed the patients History and Physical, chart, labs and discussed the procedure including the risks, benefits and alternatives for the proposed anesthesia with the patient or authorized representative who has indicated his/her understanding and acceptance.   Dental advisory given  Plan Discussed with: CRNA, Anesthesiologist and Surgeon  Anesthesia Plan Comments:         Anesthesia Quick Evaluation

## 2013-02-07 ENCOUNTER — Encounter (HOSPITAL_COMMUNITY): Payer: Self-pay | Admitting: General Surgery

## 2013-02-11 ENCOUNTER — Telehealth (INDEPENDENT_AMBULATORY_CARE_PROVIDER_SITE_OTHER): Payer: Self-pay | Admitting: General Surgery

## 2013-02-11 NOTE — Telephone Encounter (Signed)
Discussed path with patient 

## 2013-02-14 ENCOUNTER — Other Ambulatory Visit: Payer: Self-pay | Admitting: Oncology

## 2013-02-14 ENCOUNTER — Encounter: Payer: Self-pay | Admitting: Radiation Oncology

## 2013-02-15 ENCOUNTER — Ambulatory Visit: Payer: BC Managed Care – PPO | Admitting: Radiation Oncology

## 2013-02-15 ENCOUNTER — Telehealth: Payer: Self-pay | Admitting: Oncology

## 2013-02-15 ENCOUNTER — Ambulatory Visit: Payer: BC Managed Care – PPO

## 2013-02-15 NOTE — Telephone Encounter (Signed)
S/w the pt and she is aware of her march 2014 appt with dr Welton Flakes.

## 2013-02-15 NOTE — Telephone Encounter (Signed)
x

## 2013-02-19 ENCOUNTER — Telehealth (INDEPENDENT_AMBULATORY_CARE_PROVIDER_SITE_OTHER): Payer: Self-pay

## 2013-02-19 NOTE — Telephone Encounter (Signed)
Pt called stating she has noticed some stinging and tingling at breast site. No drainage, redness,swelling or fever to area. Pt states wound looks good. Pt will have Dr Welton Flakes look at area on her appt this week. Pt advised it may be due to nerves trying to regenerate after her surgery. Pt states she understands and will review this with Dr Welton Flakes.

## 2013-02-20 ENCOUNTER — Telehealth: Payer: Self-pay | Admitting: Internal Medicine

## 2013-02-20 NOTE — Telephone Encounter (Signed)
Patient needs a copy of her immunization record and also documentation of this year's flu vaccine. Please call her when ready. She needs it this week please.

## 2013-02-21 ENCOUNTER — Encounter: Payer: Self-pay | Admitting: Oncology

## 2013-02-21 ENCOUNTER — Ambulatory Visit (HOSPITAL_BASED_OUTPATIENT_CLINIC_OR_DEPARTMENT_OTHER): Payer: BC Managed Care – PPO | Admitting: Oncology

## 2013-02-21 VITALS — BP 104/71 | HR 75 | Temp 97.9°F | Resp 20 | Ht 61.0 in | Wt 124.0 lb

## 2013-02-21 DIAGNOSIS — K219 Gastro-esophageal reflux disease without esophagitis: Secondary | ICD-10-CM

## 2013-02-21 DIAGNOSIS — C50419 Malignant neoplasm of upper-outer quadrant of unspecified female breast: Secondary | ICD-10-CM

## 2013-02-21 DIAGNOSIS — D0590 Unspecified type of carcinoma in situ of unspecified breast: Secondary | ICD-10-CM

## 2013-02-21 DIAGNOSIS — G5690 Unspecified mononeuropathy of unspecified upper limb: Secondary | ICD-10-CM

## 2013-02-21 MED ORDER — GABAPENTIN 100 MG PO CAPS: 100.0000 mg | ORAL_CAPSULE | Freq: Once | ORAL | Status: DC

## 2013-02-21 NOTE — Telephone Encounter (Signed)
Patient notified to pick up at the front desk. 

## 2013-02-21 NOTE — Patient Instructions (Addendum)
Proceed with neurontin 100 mg daily x 3 days if tolerated then increase to 200 mg daily at bedtime  I will see you back in 2 months

## 2013-02-21 NOTE — Progress Notes (Signed)
OFFICE PROGRESS NOTE  CC  Michelle Harp, MD [address] [place] [address] Dr. Almond Lint Dr. Maxie Better Dr. Lurline Hare  DIAGNOSIS: 47 year old female with   #1stage I multifocal ER negative PR negative HER-2/neu positive invasive ductal carcinoma with associated ductal carcinoma in situ diagnosed in November 2010   #2 DCIS of the right breast that is ER positive.  PRIOR THERAPY:  #1 patient underwent a lumpectomy at [facility] with a sentinel node biopsy on 11/13/2009. The final pathology revealed a multifocal invasive ductal carcinoma with 3 foci of tumor all of which measured less than 2 mm. There was no associated LVI. There was high grade ductal carcinoma in situ noted with extensive necrosis. The tumor was ER negative PR negative HER-2/neu positive with a ratio of 5.28. All sentinel nodes were negative for metastatic disease.  #2 she received adjuvant systemic chemotherapy consisting of Taxol and Herceptin administered from 01/01/2010 to 03/01/2010. Overall she tolerated the systemic treatment well.  #3 patient went on to complete radiation therapy with curative intent from 04/06/2010 through 05/18/2010 to the right breast for a total of 6000 gray.  #4 patient did have genetic counseling and testing performed at [facility] for the BRCA1 and BRCA2 complete analysis. Reportedly this was normal and she did not harbor a mutation for either BRCA1 or BRCA2 gene.  #5 recent mammogram revealed calcifications in the left breast she is status post lumpectomy revealing a DCIS that was ER positive.  CURRENT THERAPY: proceed with radiation therapy  INTERVAL HISTORY: Michelle Wagner 47 y.o. female returns for followup visit today. Her last visit with me was almost over a year ago. She had a screening mammogram performed that showed calcifications in the left breast. Right breast showed no abnormalities other than surgical changes. She underwent a  core needle biopsy that showed a DCIS that was ER positive. She has gone on to see Dr. Everardo Beals who did a lumpectomy and the final pathology does reveal a small focus of DCIS that is ER positive. No invasive cancer was identified. Postoperatively patient is doing well. But she does have neuropathic pain at the surgical site. She did start liver, but that makes her dizzy and very somnolent. I have recommended that she begin Neurontin. Prescription was sent to her pharmacy. She otherwise denies any fevers chills night sweats headaches no shortness of breath no chest pains palpitations no myalgias and arthralgias. Remainder of the 10 point review of systems is negative. MEDICAL HISTORY: Past Medical History  Diagnosis Date  . Breast cancer 10/12/2009    right lumpectomy herpos ert neg dcis multilocal invasionN0M0 rs herceptin radiation 2011  . H/O gastroesophageal reflux (GERD)     endoscopy  . Hx of migraines   . DCIS (ductal carcinoma in situ) of breast 01/09/13    left   . History of radiation therapy 04/06/10-05/18/10    right breast  . PONV (postoperative nausea and vomiting)   . GERD (gastroesophageal reflux disease)   . Headache     ALLERGIES:  has No Known Allergies.  MEDICATIONS:  Current Outpatient Prescriptions  Medication Sig Dispense Refill  . acetaminophen (TYLENOL) 325 MG tablet Take 325 mg by mouth daily as needed (for headache.).      Marland Kitchen b complex vitamins capsule Take 1 capsule by mouth daily.      . Calcium Carbonate-Vitamin D (CALCIUM + D PO) Take 1 tablet by mouth daily.      . Cholecalciferol (VITAMIN  D3) 1000 UNITS CAPS Take 1 capsule by mouth daily.      . Multiple Vitamin (MULTIVITAMIN) tablet Take 1 tablet by mouth daily.      Marland Kitchen HYDROcodone-acetaminophen (NORCO) 5-325 MG per tablet Take 1-2 tablets by mouth every 6 (six) hours as needed for pain.  30 tablet  1  . ibuprofen (ADVIL,MOTRIN) 200 MG tablet Take 200 mg by mouth every 6 (six) hours as needed for headache.        No current facility-administered medications for this visit.    SURGICAL HISTORY:  Past Surgical History  Procedure Laterality Date  . Abdominal hysterectomy  2008  . Breast lumpectomy  2010    right  . Breast lumpectomy with needle localization Left 02/06/2013    Procedure: BREAST LUMPECTOMY WITH NEEDLE LOCALIZATION;  Surgeon: Almond Lint, MD;  Location: [facility] OR;  Service: General;  Laterality: Left;  left breast needle localization lumpectomy    REVIEW OF SYSTEMS:  Pertinent items are noted in HPI.   PHYSICAL EXAMINATION: General appearance: alert, cooperative and appears stated age Head: Normocephalic, without obvious abnormality, atraumatic Neck: no adenopathy, no carotid bruit, no JVD, supple, symmetrical, trachea midline and thyroid not enlarged, symmetric, no tenderness/mass/nodules Lymph nodes: Cervical, supraclavicular, and axillary nodes normal. Resp: clear to auscultation bilaterally and normal percussion bilaterally Back: symmetric, no curvature. ROM normal. No CVA tenderness. Cardio: regular rate and rhythm, S1, S2 normal, no murmur, click, rub or gallop and normal apical impulse GI: soft, non-tender; bowel sounds normal; no masses,  no organomegaly Extremities: extremities normal, atraumatic, no cyanosis or edema Neurologic: Alert and oriented X 3, normal strength and tone. Normal symmetric reflexes. Normal coordination and gait Right breast scar looks well-healed no nodularity no nipple discharge. Left breast excisional scar looks healing there is tenderness the breast is swollen. ECOG PERFORMANCE STATUS: 0 - Asymptomatic  Blood pressure 104/71, pulse 75, temperature 97.9 F (36.6 C), temperature source Oral, resp. rate 20, height 5\' 1"  (1.549 m), weight 124 lb (56.246 kg).  LABORATORY DATA: Lab Results  Component Value Date   WBC 6.4 02/01/2013   HGB 13.8 02/01/2013   HCT 38.2 02/01/2013   MCV 85.7 02/01/2013   PLT 206 02/01/2013      Chemistry      Component  Value Date/Time   NA 141 09/27/2012 0841   K 4.6 09/27/2012 0841   CL 106 09/27/2012 0841   CO2 28 09/27/2012 0841   BUN 13 09/27/2012 0841   CREATININE 0.7 09/27/2012 0841      Component Value Date/Time   CALCIUM 9.3 09/27/2012 0841   ALKPHOS 50 09/27/2012 0841   AST 18 09/27/2012 0841   ALT 13 09/27/2012 0841   BILITOT 0.7 09/27/2012 0841       RADIOGRAPHIC STUDIES:  No results found.  ASSESSMENT: A very pleasant 47 year old female with :  1. stage I multifocal ER negative PR negative HER-2/neu positive invasive ductal carcinoma with associated DCIS of the right breast originally diagnosed in November 2010. She had a lumpectomy with sentinel node biopsy at [facility] on 11/13/2009. The final pathology revealed multifocal invasive ductal carcinoma with 3 foci of tumor all of which measure less than 2 mm no LVI. Tumor was again ER negative PR negative HER-2/neu positive with a ratio of 5.28 all sentinel nodes were negative for metastatic disease. She has gone on to receive adjuvant systemic chemotherapy for consisting of Taxol and Herceptin from 12/19/2009 to March 2008 11. She has completed radiation therapy as  of 05/18/2010. She tolerated all of her treatments very well. She has also had genetic testing performed for the BRCA1 and 2 gene mutation. Patient is without any evidence of recurrent disease.  #2 patient recently had a mammogram performed that showed calcifications in the left breast. She has gone on to have a lumpectomy. The final pathology revealed a ductal carcinoma in situ that was ER positive PR positive. There was no evidence of invasive disease.  #3 she will receive adjuvant radiation therapy by Dr. Lurline Hare.  #4 once she completes the radiation she will go on antiestrogen therapy with tamoxifen 20 mg daily since she is premenopausal.  #5 patient was again seen by genetic counselor and she has had her blood drawn again for the gene panel to see what is  causing her breast cancers.  #6 neuropathic pain at the site of her lumpectomy site. PLAN:  #1 patient will proceed to radiation therapy she has an appointment set up with Dr. Lurline Hare.  #2 for her neuropathic pain she will begin Neurontin 100 mg twice a day and increase it to 3 times a day if needed. Certainly we could increase her dose as well. She will call me with any problems.  All questions were answered. The patient knows to call the clinic with any problems, questions or concerns. We can certainly see the patient much sooner if necessary.  I spent 25 minutes counseling the patient face to face. The total time spent in the appointment was 30 minutes.    Drue Second, MD Medical/Oncology [facility] [phone] (beeper) [phone] (Office)  02/21/2013, 3:22 PM

## 2013-02-27 ENCOUNTER — Ambulatory Visit
Admission: RE | Admit: 2013-02-27 | Discharge: 2013-02-27 | Disposition: A | Discharge status: Home / Self Care | Payer: BC Managed Care – PPO | Source: Ambulatory Visit | Attending: Radiation Oncology | Admitting: Radiation Oncology

## 2013-02-27 ENCOUNTER — Encounter: Payer: Self-pay | Admitting: Radiation Oncology

## 2013-02-27 VITALS — BP 93/63 | HR 80 | Temp 98.1°F | Ht 61.0 in | Wt 124.8 lb

## 2013-02-27 DIAGNOSIS — M25519 Pain in unspecified shoulder: Secondary | ICD-10-CM | POA: Insufficient documentation

## 2013-02-27 DIAGNOSIS — Y842 Radiological procedure and radiotherapy as the cause of abnormal reaction of the patient, or of later complication, without mention of misadventure at the time of the procedure: Secondary | ICD-10-CM | POA: Insufficient documentation

## 2013-02-27 DIAGNOSIS — L589 Radiodermatitis, unspecified: Secondary | ICD-10-CM | POA: Insufficient documentation

## 2013-02-27 DIAGNOSIS — G589 Mononeuropathy, unspecified: Secondary | ICD-10-CM | POA: Insufficient documentation

## 2013-02-27 DIAGNOSIS — R5381 Other malaise: Secondary | ICD-10-CM | POA: Insufficient documentation

## 2013-02-27 DIAGNOSIS — Z510 Encounter for antineoplastic radiation therapy: Secondary | ICD-10-CM | POA: Insufficient documentation

## 2013-02-27 DIAGNOSIS — D0590 Unspecified type of carcinoma in situ of unspecified breast: Secondary | ICD-10-CM | POA: Insufficient documentation

## 2013-02-27 DIAGNOSIS — C50919 Malignant neoplasm of unspecified site of unspecified female breast: Secondary | ICD-10-CM | POA: Insufficient documentation

## 2013-02-27 DIAGNOSIS — N644 Mastodynia: Secondary | ICD-10-CM | POA: Insufficient documentation

## 2013-02-27 DIAGNOSIS — L819 Disorder of pigmentation, unspecified: Secondary | ICD-10-CM | POA: Insufficient documentation

## 2013-02-27 DIAGNOSIS — L299 Pruritus, unspecified: Secondary | ICD-10-CM | POA: Insufficient documentation

## 2013-02-27 NOTE — Progress Notes (Signed)
Ms. Wingler here for assessment following Lumpectomy of her left breast.   She c/o burning pain and intermittent shooting pains in her left breast.  She is currently on Neurontin for her "nerve" pain.    Her incision is healed and  She presents with minimal swelling.   She states she has minimal fatigue presently.

## 2013-02-27 NOTE — Progress Notes (Signed)
   Department of Radiation Oncology  Phone:  [phone] Fax:        [phone]   Name: Michelle Wagner MRN: [id number]  DOB: Feb 02, 1966  Date: 02/27/2013  Follow Up Visit Note  Diagnosis: DCIS of the left breast  Summary and Interval since last radiation: (Previous radiation to the right breast June 2011)  Interval History: Michelle Wagner presents today for routine followup. She had her lumpectomy on February 26 which revealed low-grade ductal carcinoma in situ. This been 0.9 cm. Her margins were negative. Her estrogen receptor was 100% progesterone receptor at 54%. She is healed well from surgery. She unfortunately has had a postoperative course complicated by neuropathy. She's been started on Neurontin however and is having a great response to that.  Allergies: No Known Allergies  Medications:  Current Outpatient Prescriptions  Medication Sig Dispense Refill  . acetaminophen (TYLENOL) 325 MG tablet Take 325 mg by mouth daily as needed (for headache.).      Marland Kitchen b complex vitamins capsule Take 1 capsule by mouth daily.      . Calcium Carbonate-Vitamin D (CALCIUM + D PO) Take 1 tablet by mouth daily.      . Cholecalciferol (VITAMIN D3) 1000 UNITS CAPS Take 1 capsule by mouth daily.      Marland Kitchen gabapentin (NEURONTIN) 100 MG capsule Take 100 mg by mouth 3 (three) times daily.      Marland Kitchen HYDROcodone-acetaminophen (NORCO) 5-325 MG per tablet Take 1-2 tablets by mouth every 6 (six) hours as needed for pain.  30 tablet  1  . ibuprofen (ADVIL,MOTRIN) 200 MG tablet Take 200 mg by mouth every 6 (six) hours as needed for headache.      . Multiple Vitamin (MULTIVITAMIN) tablet Take 1 tablet by mouth daily.       No current facility-administered medications for this encounter.    Physical Exam:  Filed Vitals:   02/27/13 1331  BP: 93/63  Pulse: 80  Temp: 98.1 F (36.7 C)    she has a well-healed incision on the lower outer quadrant of the left breast.  IMPRESSION: Michelle Wagner is a 46 y.o. female  status post  lumpectomy and radiation for DCIS with negative margins   PLAN:   we discussed radiation treatment for prevention of local recurrence. She signed informed consent and agree to proceed forward. We discussed the possible use of breath hold technique for cardiac sparing. She will be simulated later this afternoon.    Lurline Hare, MD

## 2013-02-27 NOTE — Progress Notes (Signed)
Name: [name]   MRN: [id number]  Date:  02/27/2013  DOB: 1966-10-03  Status:outpatient    DIAGNOSIS: Breast cancer.  CONSENT VERIFIED: yes   SET UP: Patient is setup supine   IMMOBILIZATION:  The following immobilization was used:Custom Moldable Pillow, breast board.   NARRATIVE: [name] was brought to the CT Simulation planning suite.  Identity was confirmed.  All relevant records and images related to the planned course of therapy were reviewed.  Then, the patient was positioned in a stable reproducible clinical set-up for radiation therapy.  Wires were placed to delineate the clinical extent of breast tissue. A wire was placed on the scar as well.  CT images were obtained.  I analzyed the position of the heart and determined that she would not benfit from breath hold.  An isocenter was placed. Skin markings were placed.  The CT images were loaded into the planning software where the target and avoidance structures were contoured.  The radiation prescription was entered and confirmed. The patient was discharged in stable condition and tolerated simulation well.    TREATMENT PLANNING NOTE:  Treatment planning then occurred. I have requested : MLC's, isodose plan, basic dose calculation  I personally designed and supervised the construction of 3 medically necessary complex treatment devices for the protection of critical normal structures including the lungs and contralateral breast as well as the immobilization device which is necessary for set up certainty.

## 2013-02-28 NOTE — Addendum Note (Signed)
Encounter addended by: Glennie Hawk, RN on: 02/28/2013  8:06 AM<BR>     Documentation filed: Charges VN

## 2013-02-28 NOTE — Addendum Note (Signed)
Encounter addended by: Onnie Hatchel Bruner Terryon Pineiro, RN on: 02/28/2013  8:12 AM<BR>     Documentation filed: Charges VN

## 2013-03-01 ENCOUNTER — Encounter (INDEPENDENT_AMBULATORY_CARE_PROVIDER_SITE_OTHER): Payer: Self-pay | Admitting: General Surgery

## 2013-03-01 ENCOUNTER — Ambulatory Visit (INDEPENDENT_AMBULATORY_CARE_PROVIDER_SITE_OTHER): Payer: BC Managed Care – PPO | Admitting: General Surgery

## 2013-03-01 VITALS — BP 98/60 | HR 84 | Resp 18 | Ht 64.0 in | Wt 122.8 lb

## 2013-03-01 DIAGNOSIS — D0512 Intraductal carcinoma in situ of left breast: Secondary | ICD-10-CM

## 2013-03-01 DIAGNOSIS — D0590 Unspecified type of carcinoma in situ of unspecified breast: Secondary | ICD-10-CM

## 2013-03-01 MED ORDER — CELECOXIB 100 MG PO CAPS: 100.0000 mg | ORAL_CAPSULE | Freq: Two times a day (BID) | ORAL | Status: DC | PRN

## 2013-03-01 NOTE — Patient Instructions (Addendum)
Try breast massage.    OK to increase neurontin.  Follow up in 3 months.

## 2013-03-01 NOTE — Assessment & Plan Note (Addendum)
Pt having what sounds like neuropathic pain in left breast.    Has been started on Neurontin.  Advised to try aleve.  Pt requests trial of celebrex as she has had significant nausea with ibuprofen.  Follow up in 3 months.

## 2013-03-04 ENCOUNTER — Telehealth (INDEPENDENT_AMBULATORY_CARE_PROVIDER_SITE_OTHER): Payer: Self-pay

## 2013-03-04 NOTE — Telephone Encounter (Signed)
Pt's insurance will not cover Celebrex.  She informed me that her husband has some samples of Celebrex for her to try.  If it does not work she can call us for Rx of Ultram 50mg  to take with ES Tylenol.  She will let us know if she needs to change.

## 2013-03-05 NOTE — Progress Notes (Signed)
HISTORY: Pt was doing well post lumpectomy for DCIS for around a week to 2 weeks.  She has started to have pain in the left breast that is burning.  The skin is very sensitive to touch.  She was taking ibuprofen, but it started hurting her stomach.  She was started on Neurontin and this has helped some.      EXAM: General:  Alert and oriented.  No distress Incision:  Healing well.  Small hematoma.     PATHOLOGY: DCIS   ASSESSMENT AND PLAN:   DCIS (ductal carcinoma in situ) of left breast Pt having what sounds like neuropathic pain in left breast.    Has been started on Neurontin.  Advised to try aleve.  Pt requests trial of celebrex as she has had significant nausea with ibuprofen.  Follow up in 3 months.        Maudry Diego, MD Surgical Oncology, General & Endocrine Surgery [facility]  [name], MD Panosh, Neta Mends, MD

## 2013-03-06 ENCOUNTER — Encounter: Payer: Self-pay | Admitting: Radiation Oncology

## 2013-03-06 ENCOUNTER — Other Ambulatory Visit: Payer: Self-pay | Admitting: Oncology

## 2013-03-06 ENCOUNTER — Ambulatory Visit
Admission: RE | Admit: 2013-03-06 | Discharge: 2013-03-06 | Disposition: A | Discharge status: Home / Self Care | Payer: BC Managed Care – PPO | Source: Ambulatory Visit | Attending: Radiation Oncology | Admitting: Radiation Oncology

## 2013-03-06 DIAGNOSIS — C801 Malignant (primary) neoplasm, unspecified: Secondary | ICD-10-CM

## 2013-03-06 MED ORDER — GABAPENTIN 300 MG PO CAPS: 300.0000 mg | ORAL_CAPSULE | Freq: Three times a day (TID) | ORAL | Status: DC

## 2013-03-06 NOTE — Progress Notes (Signed)
  Radiation Oncology         ([phone]) [phone] ________________________________  Name: Michelle Wagner MRN: [id number]  Date: 03/06/2013  DOB: 1966-01-03  Simulation Verification Note  Status: outpatient  NARRATIVE: The patient was brought to the treatment unit and placed in the planned treatment position. The clinical setup was verified. Then port films were obtained and uploaded to the radiation oncology medical record software.  The treatment beams were carefully compared against the planned radiation fields. The position location and shape of the radiation fields was reviewed. They targeted volume of tissue appears to be appropriately covered by the radiation beams. Organs at risk appear to be excluded as planned.  Based on my personal review, I approved the simulation verification. The patient's treatment will proceed as planned.  -----------------------------------  Billie Lade, PhD, MD

## 2013-03-07 ENCOUNTER — Ambulatory Visit
Admission: RE | Admit: 2013-03-07 | Discharge: 2013-03-07 | Disposition: A | Discharge status: Home / Self Care | Payer: BC Managed Care – PPO | Source: Ambulatory Visit | Attending: Radiation Oncology | Admitting: Radiation Oncology

## 2013-03-08 ENCOUNTER — Ambulatory Visit
Admission: RE | Admit: 2013-03-08 | Discharge: 2013-03-08 | Disposition: A | Discharge status: Home / Self Care | Payer: BC Managed Care – PPO | Source: Ambulatory Visit | Attending: Radiation Oncology | Admitting: Radiation Oncology

## 2013-03-11 ENCOUNTER — Ambulatory Visit
Admission: RE | Admit: 2013-03-11 | Discharge: 2013-03-11 | Disposition: A | Discharge status: Home / Self Care | Payer: BC Managed Care – PPO | Source: Ambulatory Visit | Attending: Radiation Oncology | Admitting: Radiation Oncology

## 2013-03-12 ENCOUNTER — Ambulatory Visit: Admission: RE | Admit: 2013-03-12 | Payer: BC Managed Care – PPO | Source: Ambulatory Visit

## 2013-03-12 ENCOUNTER — Ambulatory Visit
Admission: RE | Admit: 2013-03-12 | Discharge: 2013-03-12 | Disposition: A | Discharge status: Home / Self Care | Payer: BC Managed Care – PPO | Source: Ambulatory Visit | Attending: Radiation Oncology | Admitting: Radiation Oncology

## 2013-03-12 ENCOUNTER — Encounter: Payer: Self-pay | Admitting: Radiation Oncology

## 2013-03-12 VITALS — BP 99/63 | HR 69 | Temp 98.9°F | Resp 20 | Wt 124.1 lb

## 2013-03-12 DIAGNOSIS — D0512 Intraductal carcinoma in situ of left breast: Secondary | ICD-10-CM

## 2013-03-12 DIAGNOSIS — C50919 Malignant neoplasm of unspecified site of unspecified female breast: Secondary | ICD-10-CM

## 2013-03-12 MED ORDER — RADIAPLEXRX EX GEL
Freq: Once | CUTANEOUS | Status: AC
  Administered 2013-03-12: 10:00:00 via TOPICAL

## 2013-03-12 MED ORDER — ALRA NON-METALLIC DEODORANT (RAD-ONC)
1.0000 "application " | Freq: Once | TOPICAL | Status: AC
  Administered 2013-03-12: 1 via TOPICAL

## 2013-03-12 NOTE — Progress Notes (Signed)
Post sim education completed w/pt. Gave pt "Radiation and You" booklet w/all pertinent information marked and discussed, re: fatigue, skin irritation/care, pain, nutrition. Gave pt Radiaplex, Alra w/instructions for proper use.  All questions answered.

## 2013-03-12 NOTE — Progress Notes (Signed)
Weekly Management Note Current Dose:10.68 Gy  Projected Dose: 52.72 Gy   Narrative:  The patient presents for routine under treatment assessment.  CBCT/MVCT images/Port film x-rays were reviewed.  The chart was checked. Doing well. Pain continues in breast. Relieved by neurontin somewhat.   Physical Findings: Weight: 124 lb 1.6 oz (56.291 kg). Unchanged  Impression:  The patient is tolerating radiation.  Plan:  Continue treatment as planned. RN education performed today.

## 2013-03-12 NOTE — Progress Notes (Signed)
Post sim ed completed, charted under pt education appt. Pt c/o "neuropathy pain in her left breast, shoulder". She states it is "like sharp, sometimes burning pain". She is taking Gabapentin, states she plans to increase to 300 mg tid today. Pt denies fatigue, loss of appetite.

## 2013-03-13 ENCOUNTER — Telehealth: Payer: Self-pay | Admitting: Genetic Counselor

## 2013-03-13 ENCOUNTER — Ambulatory Visit
Admission: RE | Admit: 2013-03-13 | Discharge: 2013-03-13 | Disposition: A | Discharge status: Home / Self Care | Payer: BC Managed Care – PPO | Source: Ambulatory Visit | Attending: Radiation Oncology | Admitting: Radiation Oncology

## 2013-03-13 NOTE — Telephone Encounter (Signed)
Revealed negative test results 

## 2013-03-14 ENCOUNTER — Ambulatory Visit
Admission: RE | Admit: 2013-03-14 | Discharge: 2013-03-14 | Disposition: A | Discharge status: Home / Self Care | Payer: BC Managed Care – PPO | Source: Ambulatory Visit | Attending: Radiation Oncology | Admitting: Radiation Oncology

## 2013-03-15 ENCOUNTER — Ambulatory Visit
Admission: RE | Admit: 2013-03-15 | Discharge: 2013-03-15 | Disposition: A | Discharge status: Home / Self Care | Payer: BC Managed Care – PPO | Source: Ambulatory Visit | Attending: Radiation Oncology | Admitting: Radiation Oncology

## 2013-03-15 ENCOUNTER — Encounter: Payer: Self-pay | Admitting: Genetic Counselor

## 2013-03-18 ENCOUNTER — Ambulatory Visit
Admission: RE | Admit: 2013-03-18 | Discharge: 2013-03-18 | Disposition: A | Discharge status: Home / Self Care | Payer: BC Managed Care – PPO | Source: Ambulatory Visit | Attending: Radiation Oncology | Admitting: Radiation Oncology

## 2013-03-19 ENCOUNTER — Encounter: Payer: Self-pay | Admitting: Radiation Oncology

## 2013-03-19 ENCOUNTER — Ambulatory Visit
Admission: RE | Admit: 2013-03-19 | Discharge: 2013-03-19 | Disposition: A | Discharge status: Home / Self Care | Payer: BC Managed Care – PPO | Source: Ambulatory Visit | Attending: Radiation Oncology | Admitting: Radiation Oncology

## 2013-03-19 VITALS — BP 92/60 | HR 74 | Temp 98.4°F | Resp 20 | Wt 125.6 lb

## 2013-03-19 DIAGNOSIS — C50919 Malignant neoplasm of unspecified site of unspecified female breast: Secondary | ICD-10-CM

## 2013-03-19 NOTE — Progress Notes (Signed)
Pt denies pain, states taking Neurontin tid has been helpful w/sharp pains in left breast/shoulder she has experienced. She states her left breast and nipple are sore, sensitive. She is applying Radiaplex to left breast tx area. She states she occasionally is fatigued, no loss of appetite. She states she feels her multivitamin has been causing her nausea, so she has stopped taking.

## 2013-03-19 NOTE — Progress Notes (Signed)
Weekly Management Note Current Dose:  21.36 Gy  Projected Dose: 52.72 Gy   Narrative:  The patient presents for routine under treatment assessment.  CBCT/MVCT images/Port film x-rays were reviewed.  The chart was checked. Doing well. Pain is less. On gabapentin tid with good pain control.   Physical Findings: Weight: 125 lb 9.6 oz (56.972 kg). Very slight darkness of skin  Impression:  The patient is tolerating radiation.  Plan:  Continue treatment as planned. Continue radiaplex.

## 2013-03-20 ENCOUNTER — Ambulatory Visit
Admission: RE | Admit: 2013-03-20 | Discharge: 2013-03-20 | Disposition: A | Discharge status: Home / Self Care | Payer: BC Managed Care – PPO | Source: Ambulatory Visit | Attending: Radiation Oncology | Admitting: Radiation Oncology

## 2013-03-20 ENCOUNTER — Encounter: Payer: Self-pay | Admitting: Radiation Oncology

## 2013-03-20 NOTE — Progress Notes (Signed)
Name: Michelle Wagner   MRN: [id number]  Date:  03/20/2013   DOB: 29-Nov-1966  Status:outpatient    DIAGNOSIS: Breast cancer.  CONSENT VERIFIED: yes   SET UP: Patient is setup supine   IMMOBILIZATION:  The following immobilization was used:Custom Moldable Pillow, breast board.   NARRATIVE: Michelle Wagner underwent complex simulation and treatment planning for her boost treatment today.  Her tumor volume was outlined on the planning CT scan. The depth of her cavity was 4.9 Cm.     15 MeV electrons will be prescribed to the 90% Isodose line.   A block will be used for beam modification purposes.  A special port plan is requested.

## 2013-03-21 ENCOUNTER — Ambulatory Visit
Admission: RE | Admit: 2013-03-21 | Discharge: 2013-03-21 | Disposition: A | Discharge status: Home / Self Care | Payer: BC Managed Care – PPO | Source: Ambulatory Visit | Attending: Radiation Oncology | Admitting: Radiation Oncology

## 2013-03-22 ENCOUNTER — Ambulatory Visit
Admission: RE | Admit: 2013-03-22 | Discharge: 2013-03-22 | Disposition: A | Discharge status: Home / Self Care | Payer: BC Managed Care – PPO | Source: Ambulatory Visit | Attending: Radiation Oncology | Admitting: Radiation Oncology

## 2013-03-25 ENCOUNTER — Ambulatory Visit
Admission: RE | Admit: 2013-03-25 | Discharge: 2013-03-25 | Disposition: A | Discharge status: Home / Self Care | Payer: BC Managed Care – PPO | Source: Ambulatory Visit | Attending: Radiation Oncology | Admitting: Radiation Oncology

## 2013-03-26 ENCOUNTER — Ambulatory Visit
Admission: RE | Admit: 2013-03-26 | Discharge: 2013-03-26 | Disposition: A | Discharge status: Home / Self Care | Payer: BC Managed Care – PPO | Source: Ambulatory Visit | Attending: Radiation Oncology | Admitting: Radiation Oncology

## 2013-03-26 DIAGNOSIS — C50919 Malignant neoplasm of unspecified site of unspecified female breast: Secondary | ICD-10-CM

## 2013-03-26 DIAGNOSIS — C50912 Malignant neoplasm of unspecified site of left female breast: Secondary | ICD-10-CM

## 2013-03-26 NOTE — Progress Notes (Signed)
Documented on electron encounter.

## 2013-03-26 NOTE — Progress Notes (Signed)
Weekly Management Note Current Dose: 34.71  Gy  Projected Dose: 42.72 Gy   Narrative:  The patient presents for routine under treatment assessment.  CBCT/MVCT images/Port film x-rays were reviewed.  The chart was checked. Doing well. Some irritation medially. Saw on machine for ebeam set up which was good. Still on neurontin bid.   Physical Findings: Slightly dark left breast  Impression:  The patient is tolerating radiation.  Plan:  Continue treatment as planned.

## 2013-03-26 NOTE — Progress Notes (Signed)
Pt seen by dr in treatment area, no nursing eval completed today.

## 2013-03-27 ENCOUNTER — Ambulatory Visit
Admission: RE | Admit: 2013-03-27 | Discharge: 2013-03-27 | Disposition: A | Discharge status: Home / Self Care | Payer: BC Managed Care – PPO | Source: Ambulatory Visit | Attending: Radiation Oncology | Admitting: Radiation Oncology

## 2013-03-28 ENCOUNTER — Ambulatory Visit
Admission: RE | Admit: 2013-03-28 | Discharge: 2013-03-28 | Disposition: A | Discharge status: Home / Self Care | Payer: BC Managed Care – PPO | Source: Ambulatory Visit | Attending: Radiation Oncology | Admitting: Radiation Oncology

## 2013-03-29 ENCOUNTER — Ambulatory Visit
Admission: RE | Admit: 2013-03-29 | Discharge: 2013-03-29 | Disposition: A | Discharge status: Home / Self Care | Payer: BC Managed Care – PPO | Source: Ambulatory Visit | Attending: Radiation Oncology | Admitting: Radiation Oncology

## 2013-04-01 ENCOUNTER — Ambulatory Visit
Admission: RE | Admit: 2013-04-01 | Discharge: 2013-04-01 | Disposition: A | Discharge status: Home / Self Care | Payer: BC Managed Care – PPO | Source: Ambulatory Visit | Attending: Radiation Oncology | Admitting: Radiation Oncology

## 2013-04-02 ENCOUNTER — Ambulatory Visit
Admission: RE | Admit: 2013-04-02 | Discharge: 2013-04-02 | Disposition: A | Discharge status: Home / Self Care | Payer: BC Managed Care – PPO | Source: Ambulatory Visit | Attending: Radiation Oncology | Admitting: Radiation Oncology

## 2013-04-02 ENCOUNTER — Encounter: Payer: Self-pay | Admitting: Radiation Oncology

## 2013-04-02 VITALS — BP 92/63 | HR 80 | Temp 97.6°F | Resp 20 | Wt 127.4 lb

## 2013-04-02 DIAGNOSIS — D0512 Intraductal carcinoma in situ of left breast: Secondary | ICD-10-CM

## 2013-04-02 NOTE — Progress Notes (Signed)
Weekly Management Note Current Dose: 46.72  Gy  Projected Dose: 52.72 Gy   Narrative:  The patient presents for routine under treatment assessment.  CBCT/MVCT images/Port film x-rays were reviewed.  The chart was checked. Doing well. Increased neuropathic pain over past few days. Using radiaplex. Has appt with med onc.   Physical Findings: Weight: 127 lb 6.4 oz (57.788 kg). Dermatitis over left breast. Skin itnact.   Impression:  The patient is tolerating radiation.  Plan:  Continue treatment as planned. Continue radaiplex x 2 weeks then lotion with vit e. Follow up in 1 month.

## 2013-04-02 NOTE — Progress Notes (Signed)
Pt reports "discomfort" of left breast and axilla. She also states she has increased Gabapentin to 300 mg qid past few days for increased neuropathy pain. She states she feels after radiation is complete she will be able to reduce this med. Pt applying Radiaplex to left breast tx area for hyperpigmentation. She c/o occasional itching under breast and in supraclavicular area; advised she can apply 1% Cortisone to upper area but not under breast.  She is fatigued.

## 2013-04-03 ENCOUNTER — Ambulatory Visit
Admission: RE | Admit: 2013-04-03 | Discharge: 2013-04-03 | Disposition: A | Discharge status: Home / Self Care | Payer: BC Managed Care – PPO | Source: Ambulatory Visit | Attending: Radiation Oncology | Admitting: Radiation Oncology

## 2013-04-04 ENCOUNTER — Ambulatory Visit: Payer: BC Managed Care – PPO

## 2013-04-04 ENCOUNTER — Ambulatory Visit
Admission: RE | Admit: 2013-04-04 | Discharge: 2013-04-04 | Disposition: A | Discharge status: Home / Self Care | Payer: BC Managed Care – PPO | Source: Ambulatory Visit | Attending: Radiation Oncology | Admitting: Radiation Oncology

## 2013-04-05 ENCOUNTER — Ambulatory Visit: Payer: BC Managed Care – PPO

## 2013-04-05 ENCOUNTER — Encounter: Payer: Self-pay | Admitting: Radiation Oncology

## 2013-04-05 ENCOUNTER — Ambulatory Visit
Admission: RE | Admit: 2013-04-05 | Discharge: 2013-04-05 | Disposition: A | Discharge status: Home / Self Care | Payer: BC Managed Care – PPO | Source: Ambulatory Visit | Attending: Radiation Oncology | Admitting: Radiation Oncology

## 2013-04-08 NOTE — Progress Notes (Signed)
  Radiation Oncology         (336) [phone] ________________________________  Name: Michelle Wagner MRN: [id number]  Date: 04/05/2013  DOB: [birth year]  End of Treatment Note  Diagnosis:   The encounter diagnosis was DCIS (ductal carcinoma in situ) of breast.  Indication for treatment:  Curative       Radiation treatment dates:   03/07/2013-04/05/2013  Site/dose:   Left breast 42.72 @2 .67 Gy per fraction x 16 fractions followed by a 10 Gy boost to the tumor bed at 2 Gy per fraction x 5 fractions  Beams/energy:   6 MV photons with 15 MeV electrons for the boost  Narrative: The patient tolerated radiation treatment relatively well. She had pain in her lumpectomy site prior to beginning radiation. This was controlled with Neurontin. She had the expected skin toxicity which was managed with Radiaplex.  Plan: The patient has completed radiation treatment. The patient will return to radiation oncology clinic for routine followup in one month. I advised them to call or return sooner if they have any questions or concerns related to their recovery or treatment.  ------------------------------------------------  Lurline Hare, MD

## 2013-04-10 ENCOUNTER — Encounter: Payer: Self-pay | Admitting: Radiation Oncology

## 2013-04-10 NOTE — Progress Notes (Signed)
On 03/05/13 3D simulation was performed.  I requested and analyzed a DVH of the heart, lungs and lumpectomy cavity. I also ensured no overlap between her previous fields and her current fields.

## 2013-05-07 ENCOUNTER — Encounter: Payer: Self-pay | Admitting: Radiation Oncology

## 2013-05-09 ENCOUNTER — Encounter: Payer: Self-pay | Admitting: Radiation Oncology

## 2013-05-09 ENCOUNTER — Ambulatory Visit
Admission: RE | Admit: 2013-05-09 | Discharge: 2013-05-09 | Disposition: A | Discharge status: Home / Self Care | Payer: BC Managed Care – PPO | Source: Ambulatory Visit | Attending: Radiation Oncology | Admitting: Radiation Oncology

## 2013-05-09 VITALS — BP 99/68 | HR 75 | Temp 98.3°F | Resp 20 | Wt 126.3 lb

## 2013-05-09 DIAGNOSIS — D0512 Intraductal carcinoma in situ of left breast: Secondary | ICD-10-CM

## 2013-05-09 NOTE — Progress Notes (Signed)
Follow up radiation txs left breast:3/27-14-04/05/13, alert,oriented x3 Left breast with tanning, discoloration, well healed, still has burning, shooting pain,  Neuropathy stated, "It is better than before though"

## 2013-05-10 NOTE — Progress Notes (Signed)
   Department of Radiation Oncology  Phone:  [phone] Fax:        [phone]   Name: Michelle Wagner MRN: [id number]  DOB: 04-23-1966  Date: 05/09/2013  Follow Up Visit Note  Diagnosis: DCIS of the left breast (previous right breast cancer)  Summary and Interval since last radiation: Hyperfractionated radiation to a total dose of 52.72 gray completed 04/05/2013  Interval History: Michelle Wagner presents today for routine followup.  She is still struggling with nerve pain in her breast. She is taking Neurontin twice a day. She has not started taking tamoxifen. Dr. Welton Flakes would like this neuropathy to improve before starting. She has been using Radiaplex some lotion on her skin. There still some discoloration but is getting better.  Allergies: No Known Allergies  Medications:  Current Outpatient Prescriptions  Medication Sig Dispense Refill  . acetaminophen (TYLENOL) 325 MG tablet Take 325 mg by mouth daily as needed (for headache.).      Marland Kitchen b complex vitamins capsule Take 1 capsule by mouth daily.      . Calcium Carbonate-Vitamin D (CALCIUM + D PO) Take 1 tablet by mouth daily.      . Cholecalciferol (VITAMIN D3) 1000 UNITS CAPS Take 1 capsule by mouth daily.      Marland Kitchen gabapentin (NEURONTIN) 100 MG capsule Take 100 mg by mouth 3 (three) times daily.      Marland Kitchen gabapentin (NEURONTIN) 300 MG capsule Take 1 capsule (300 mg total) by mouth 3 (three) times daily.  180 capsule  6  . hyaluronate sodium (RADIAPLEXRX) GEL Apply topically 2 (two) times daily.      . Multiple Vitamin (MULTIVITAMIN) tablet Take 1 tablet by mouth daily.      . non-metallic deodorant Thornton Papas) MISC Apply 1 application topically daily as needed.      . celecoxib (CELEBREX) 100 MG capsule Take 1 capsule (100 mg total) by mouth 2 (two) times daily as needed for pain.  60 capsule  2  . ibuprofen (ADVIL,MOTRIN) 200 MG tablet Take 200 mg by mouth every 6 (six) hours as needed for headache.       No current facility-administered  medications for this encounter.    Physical Exam:  Filed Vitals:   05/09/13 1357  BP: 99/68  Pulse: 75  Temp: 98.3 F (36.8 C)  Resp: 20   she has some tanning of her left breast. This is particularly worse over her lumpectomy cavity.  IMPRESSION: Michelle Wagner is a 47 y.o. female status post breast conservation with resolving acute effects of treatment  PLAN:  Her skin is improving. I encouraged her to continue using lotion plus vitamin E. She has followup with medical oncology to discuss antiestrogen therapy. We discussed the use of sun protection in the treated area. She knows she can call as needed with questions.    Lurline Hare, MD

## 2013-06-12 ENCOUNTER — Other Ambulatory Visit (HOSPITAL_BASED_OUTPATIENT_CLINIC_OR_DEPARTMENT_OTHER): Payer: BC Managed Care – PPO | Admitting: Lab

## 2013-06-12 ENCOUNTER — Telehealth: Payer: Self-pay | Admitting: Oncology

## 2013-06-12 ENCOUNTER — Ambulatory Visit (HOSPITAL_BASED_OUTPATIENT_CLINIC_OR_DEPARTMENT_OTHER): Payer: BC Managed Care – PPO | Admitting: Oncology

## 2013-06-12 ENCOUNTER — Encounter: Payer: Self-pay | Admitting: Oncology

## 2013-06-12 VITALS — BP 105/68 | HR 69 | Temp 98.1°F | Resp 20 | Ht 64.0 in | Wt 126.0 lb

## 2013-06-12 DIAGNOSIS — D0590 Unspecified type of carcinoma in situ of unspecified breast: Secondary | ICD-10-CM

## 2013-06-12 DIAGNOSIS — C50919 Malignant neoplasm of unspecified site of unspecified female breast: Secondary | ICD-10-CM

## 2013-06-12 DIAGNOSIS — D0512 Intraductal carcinoma in situ of left breast: Secondary | ICD-10-CM

## 2013-06-12 LAB — CBC WITH DIFFERENTIAL/PLATELET
Basophils Absolute: 0.2 10*3/uL — ABNORMAL HIGH (ref 0.0–0.1)
EOS%: 2.1 % (ref 0.0–7.0)
HGB: 13.7 g/dL (ref 11.6–15.9)
LYMPH%: 26.7 % (ref 14.0–49.7)
MCH: 30.3 pg (ref 25.1–34.0)
MCV: 87.6 fL (ref 79.5–101.0)
MONO%: 6.4 % (ref 0.0–14.0)
Platelets: 202 10*3/uL (ref 145–400)
RDW: 12.6 % (ref 11.2–14.5)

## 2013-06-12 LAB — COMPREHENSIVE METABOLIC PANEL (CC13)
Alkaline Phosphatase: 50 U/L (ref 40–150)
BUN: 15 mg/dL (ref 7.0–26.0)
CO2: 26 mEq/L (ref 22–29)
Creatinine: 0.8 mg/dL (ref 0.6–1.1)
Glucose: 102 mg/dl (ref 70–140)
Sodium: 138 mEq/L (ref 136–145)
Total Bilirubin: 0.54 mg/dL (ref 0.20–1.20)
Total Protein: 7.5 g/dL (ref 6.4–8.3)

## 2013-06-12 MED ORDER — GABAPENTIN 100 MG PO CAPS: 100.0000 mg | ORAL_CAPSULE | Freq: Three times a day (TID) | ORAL | Status: DC

## 2013-06-12 MED ORDER — TAMOXIFEN CITRATE 20 MG PO TABS: 20.0000 mg | ORAL_TABLET | Freq: Every day | ORAL | Status: DC

## 2013-06-12 MED ORDER — VENLAFAXINE HCL ER 37.5 MG PO CP24: 37.5000 mg | ORAL_CAPSULE | Freq: Every day | ORAL | Status: DC

## 2013-06-12 NOTE — Patient Instructions (Addendum)
Proceed with tamoxifen 20 mg daily  Begin effexor 37.5 mg daily for hot flashes  Continue neurontin 100 mg daily  I will see you back in 3 months

## 2013-06-17 NOTE — Progress Notes (Signed)
OFFICE PROGRESS NOTE  CC  [name], MD [address] [place] [address] Dr. Almond Lint Dr. Maxie Better Dr. Lurline Hare  DIAGNOSIS: 47 year old female with   #1stage I multifocal ER negative PR negative HER-2/neu positive invasive ductal carcinoma with associated ductal carcinoma in situ diagnosed in November 2010   #2 DCIS of the right breast that is ER positive.  PRIOR THERAPY:  #1 patient underwent a lumpectomy at [facility] with a sentinel node biopsy on 11/13/2009. The final pathology revealed a multifocal invasive ductal carcinoma with 3 foci of tumor all of which measured less than 2 mm. There was no associated LVI. There was high grade ductal carcinoma in situ noted with extensive necrosis. The tumor was ER negative PR negative HER-2/neu positive with a ratio of 5.28. All sentinel nodes were negative for metastatic disease.  #2 she received adjuvant systemic chemotherapy consisting of Taxol and Herceptin administered from 01/01/2010 to 03/01/2010. Overall she tolerated the systemic treatment well.  #3 patient went on to complete radiation therapy with curative intent from 04/06/2010 through 05/18/2010 to the right breast for a total of 6000 gray.  #4 patient did have genetic counseling and testing performed at [facility] for the BRCA1 and BRCA2 complete analysis. Reportedly this was normal and she did not harbor a mutation for either BRCA1 or BRCA2 gene.  #5 recent mammogram revealed calcifications in the left breast she is status post lumpectomy revealing a DCIS that was ER positive.  #6 patient is status post lumpectomy and now followed by radiation completed on 04/05/2013.  #7 patient will begin tamoxifen 20 mg daily for a total of 5-10 years. Risks and benefits of this were explained to the patient. She will begin 06/12/2013  CURRENT THERAPY: Tamoxifen 20 mg daily starting 06/12/2013  INTERVAL HISTORY: Michelle Wagner 47 y.o.  female returns for followup visit today. Overall patient is doing well. She denies any fevers chills night sweats headaches shortness of breath chest pains palpitations her neuropathic pain in the left breast at the surgical site is significantly improved. She has discontinued taking the Neurontin. I have recommended she restarted since she does occasionally get trying some pain. Remainder of the 10 point review of systems is negative.  MEDICAL HISTORY: Past Medical History  Diagnosis Date  . Breast cancer 10/12/2009    right lumpectomy herpos ert neg dcis multilocal [id number] rs herceptin radiation 2011  . H/O gastroesophageal reflux (GERD)     endoscopy  . Hx of migraines   . DCIS (ductal carcinoma in situ) of breast 01/09/13    left   . History of radiation therapy 04/06/10-05/18/10    right breast  . PONV (postoperative nausea and vomiting)   . GERD (gastroesophageal reflux disease)   . Headache(784.0)   . History of radiation therapy 03/07/13-04/05/13    left breast/ 52.72 total Gy/27fx    ALLERGIES:  has No Known Allergies.  MEDICATIONS:  Current Outpatient Prescriptions  Medication Sig Dispense Refill  . b complex vitamins capsule Take 1 capsule by mouth daily.      . Calcium Carbonate-Vitamin D (CALCIUM + D PO) Take 1 tablet by mouth daily.      . Cholecalciferol (VITAMIN D3) 1000 UNITS CAPS Take 1 capsule by mouth daily.      . hyaluronate sodium (RADIAPLEXRX) GEL Apply topically 2 (two) times daily.      . Multiple Vitamin (MULTIVITAMIN) tablet Take 1 tablet by mouth daily.      Marland Kitchen  acetaminophen (TYLENOL) 325 MG tablet Take 325 mg by mouth daily as needed (for headache.).      Marland Kitchen celecoxib (CELEBREX) 100 MG capsule Take 1 capsule (100 mg total) by mouth 2 (two) times daily as needed for pain.  60 capsule  2  . gabapentin (NEURONTIN) 100 MG capsule Take 1 capsule (100 mg total) by mouth 3 (three) times daily.  90 capsule  6  . gabapentin (NEURONTIN) 300 MG capsule Take 1 capsule  (300 mg total) by mouth 3 (three) times daily.  180 capsule  6  . ibuprofen (ADVIL,MOTRIN) 200 MG tablet Take 200 mg by mouth every 6 (six) hours as needed for headache.      . non-metallic deodorant Thornton Papas) MISC Apply 1 application topically daily as needed.      . tamoxifen (NOLVADEX) 20 MG tablet Take 1 tablet (20 mg total) by mouth daily.  90 tablet  12  . venlafaxine XR (EFFEXOR-XR) 37.5 MG 24 hr capsule Take 1 capsule (37.5 mg total) by mouth daily.  30 capsule  12   No current facility-administered medications for this visit.    SURGICAL HISTORY:  Past Surgical History  Procedure Laterality Date  . Abdominal hysterectomy  2008  . Breast lumpectomy  2010    right  . Breast lumpectomy with needle localization Left 02/06/2013    Procedure: BREAST LUMPECTOMY WITH NEEDLE LOCALIZATION;  Surgeon: Almond Lint, MD;  Location: [facility] OR;  Service: General;  Laterality: Left;  left breast needle localization lumpectomy    REVIEW OF SYSTEMS:  Pertinent items are noted in HPI.   PHYSICAL EXAMINATION: General appearance: alert, cooperative and appears stated age Head: Normocephalic, without obvious abnormality, atraumatic Neck: no adenopathy, no carotid bruit, no JVD, supple, symmetrical, trachea midline and thyroid not enlarged, symmetric, no tenderness/mass/nodules Lymph nodes: Cervical, supraclavicular, and axillary nodes normal. Resp: clear to auscultation bilaterally and normal percussion bilaterally Back: symmetric, no curvature. ROM normal. No CVA tenderness. Cardio: regular rate and rhythm, S1, S2 normal, no murmur, click, rub or gallop and normal apical impulse GI: soft, non-tender; bowel sounds normal; no masses,  no organomegaly Extremities: extremities normal, atraumatic, no cyanosis or edema Neurologic: Alert and oriented X 3, normal strength and tone. Normal symmetric reflexes. Normal coordination and gait Right breast scar looks well-healed no nodularity no nipple discharge. Left  breast excisional scar looks healing there is tenderness the breast is swollen. ECOG PERFORMANCE STATUS: 0 - Asymptomatic  Blood pressure 105/68, pulse 69, temperature 98.1 F (36.7 C), temperature source Oral, resp. rate 20, height 5\' 4"  (1.626 m), weight 126 lb (57.153 kg).  LABORATORY DATA: Lab Results  Component Value Date   WBC 5.6 06/12/2013   HGB 13.7 06/12/2013   HCT 39.6 06/12/2013   MCV 87.6 06/12/2013   PLT 202 06/12/2013      Chemistry      Component Value Date/Time   NA 138 06/12/2013 1150   NA 141 09/27/2012 0841   K 4.3 06/12/2013 1150   K 4.6 09/27/2012 0841   CL 106 09/27/2012 0841   CO2 26 06/12/2013 1150   CO2 28 09/27/2012 0841   BUN 15.0 06/12/2013 1150   BUN 13 09/27/2012 0841   CREATININE 0.8 06/12/2013 1150   CREATININE 0.7 09/27/2012 0841      Component Value Date/Time   CALCIUM 9.5 06/12/2013 1150   CALCIUM 9.3 09/27/2012 0841   ALKPHOS 50 06/12/2013 1150   ALKPHOS 50 09/27/2012 0841   AST 17 06/12/2013 1150  AST 18 09/27/2012 0841   ALT 14 06/12/2013 1150   ALT 13 09/27/2012 0841   BILITOT 0.54 06/12/2013 1150   BILITOT 0.7 09/27/2012 0841       RADIOGRAPHIC STUDIES:  No results found.  ASSESSMENT: A very pleasant 47 year old female with :  1. stage I multifocal ER negative PR negative HER-2/neu positive invasive ductal carcinoma with associated DCIS of the right breast originally diagnosed in November 2010. She had a lumpectomy with sentinel node biopsy at [facility] on 11/13/2009. The final pathology revealed multifocal invasive ductal carcinoma with 3 foci of tumor all of which measure less than 2 mm no LVI. Tumor was again ER negative PR negative HER-2/neu positive with a ratio of 5.28 all sentinel nodes were negative for metastatic disease. She has gone on to receive adjuvant systemic chemotherapy for consisting of Taxol and Herceptin from 12/19/2009 to March 2008 11. She has completed radiation therapy as of 05/18/2010. She tolerated all of her treatments  very well. She has also had genetic testing performed for the BRCA1 and 2 gene mutation. Patient is without any evidence of recurrent disease.  #2 patient recently had a mammogram performed that showed calcifications in the left breast. She has gone on to have a lumpectomy. The final pathology revealed a ductal carcinoma in situ that was ER positive PR positive. There was no evidence of invasive disease.  #3 patient completed radiation therapy to the left breast administered from 03/08/2013 through 04/05/2013.  #4 she is now to begin antiestrogen therapy with tamoxifen 20 mg daily. Risks and benefits of treatment were discussed with the patient. For hot flashes we discussed the possibility of starting her on Effexor 37.5 mg daily. Both prescriptions were given to her today.  #6 neuropathic pain at the site of her lumpectomy site improved  PLAN:  #1 begin tamoxifen 20 mg daily.  #2 Effexor 37.5 mg daily for hot flashes.  #3 she will return in 3 months time for followup.  All questions were answered. The patient knows to call the clinic with any problems, questions or concerns. We can certainly see the patient much sooner if necessary.  I spent 25 minutes counseling the patient face to face. The total time spent in the appointment was 30 minutes.    Drue Second, MD Medical/Oncology [facility] [phone] (beeper) [phone] (Office)

## 2013-09-16 ENCOUNTER — Telehealth: Payer: Self-pay | Admitting: Oncology

## 2013-09-18 ENCOUNTER — Ambulatory Visit (INDEPENDENT_AMBULATORY_CARE_PROVIDER_SITE_OTHER): Payer: BC Managed Care – PPO | Admitting: Family Medicine

## 2013-09-18 DIAGNOSIS — Z23 Encounter for immunization: Secondary | ICD-10-CM

## 2013-09-20 ENCOUNTER — Ambulatory Visit: Payer: BC Managed Care – PPO | Admitting: Oncology

## 2013-09-20 ENCOUNTER — Other Ambulatory Visit: Payer: BC Managed Care – PPO | Admitting: Lab

## 2013-10-11 ENCOUNTER — Other Ambulatory Visit (HOSPITAL_BASED_OUTPATIENT_CLINIC_OR_DEPARTMENT_OTHER): Payer: BC Managed Care – PPO | Admitting: Lab

## 2013-10-11 ENCOUNTER — Encounter (INDEPENDENT_AMBULATORY_CARE_PROVIDER_SITE_OTHER): Payer: Self-pay

## 2013-10-11 ENCOUNTER — Ambulatory Visit (HOSPITAL_BASED_OUTPATIENT_CLINIC_OR_DEPARTMENT_OTHER): Payer: BC Managed Care – PPO | Admitting: Oncology

## 2013-10-11 ENCOUNTER — Telehealth: Payer: Self-pay | Admitting: Oncology

## 2013-10-11 ENCOUNTER — Encounter: Payer: Self-pay | Admitting: Oncology

## 2013-10-11 VITALS — BP 103/68 | HR 72 | Temp 98.1°F | Resp 20 | Ht 64.0 in | Wt 125.5 lb

## 2013-10-11 DIAGNOSIS — C50419 Malignant neoplasm of upper-outer quadrant of unspecified female breast: Secondary | ICD-10-CM

## 2013-10-11 DIAGNOSIS — D0590 Unspecified type of carcinoma in situ of unspecified breast: Secondary | ICD-10-CM

## 2013-10-11 DIAGNOSIS — C50919 Malignant neoplasm of unspecified site of unspecified female breast: Secondary | ICD-10-CM

## 2013-10-11 DIAGNOSIS — Z170 Estrogen receptor positive status [ER+]: Secondary | ICD-10-CM

## 2013-10-11 DIAGNOSIS — D0512 Intraductal carcinoma in situ of left breast: Secondary | ICD-10-CM

## 2013-10-11 DIAGNOSIS — G5690 Unspecified mononeuropathy of unspecified upper limb: Secondary | ICD-10-CM

## 2013-10-11 LAB — CBC WITH DIFFERENTIAL/PLATELET
BASO%: 1.1 % (ref 0.0–2.0)
HCT: 38.7 % (ref 34.8–46.6)
LYMPH%: 24.2 % (ref 14.0–49.7)
MCH: 29.8 pg (ref 25.1–34.0)
MCHC: 33.5 g/dL (ref 31.5–36.0)
MCV: 89.1 fL (ref 79.5–101.0)
MONO#: 0.4 10*3/uL (ref 0.1–0.9)
NEUT%: 65.6 % (ref 38.4–76.8)
Platelets: 199 10*3/uL (ref 145–400)
WBC: 6.3 10*3/uL (ref 3.9–10.3)

## 2013-10-11 LAB — COMPREHENSIVE METABOLIC PANEL (CC13)
ALT: 15 U/L (ref 0–55)
Anion Gap: 7 mEq/L (ref 3–11)
CO2: 25 mEq/L (ref 22–29)
Creatinine: 0.7 mg/dL (ref 0.6–1.1)
Total Bilirubin: 0.35 mg/dL (ref 0.20–1.20)

## 2013-10-11 MED ORDER — LORAZEPAM 0.5 MG PO TABS: 0.5000 mg | ORAL_TABLET | Freq: Three times a day (TID) | ORAL | Status: DC

## 2013-10-11 NOTE — Progress Notes (Signed)
OFFICE PROGRESS NOTE  CC  Michelle Harp, MD [address] [place] [address] Dr. Almond Wagner Dr. Maxie Wagner Dr. Lurline Wagner  DIAGNOSIS: 47 year old female with   #1stage I multifocal ER negative PR negative HER-2/neu positive invasive ductal carcinoma with associated ductal carcinoma in situ diagnosed in November 2010   #2 DCIS of the right breast that is ER positive.  PRIOR THERAPY:  #1 patient underwent a lumpectomy at [facility] with a sentinel node biopsy on 11/13/2009. The final pathology revealed a multifocal invasive ductal carcinoma with 3 foci of tumor all of which measured less than 2 mm. There was no associated LVI. There was high grade ductal carcinoma in situ noted with extensive necrosis. The tumor was ER negative PR negative HER-2/neu positive with a ratio of 5.28. All sentinel nodes were negative for metastatic disease.  #2 she received adjuvant systemic chemotherapy consisting of Taxol and Herceptin administered from 01/01/2010 to 03/01/2010. Overall she tolerated the systemic treatment well.  #3 patient went on to complete radiation therapy with curative intent from 04/06/2010 through 05/18/2010 to the right breast for a total of 6000 gray.  #4 patient did have genetic counseling and testing performed at [facility] for the BRCA1 and BRCA2 complete analysis. Reportedly this was normal and she did not harbor a mutation for either BRCA1 or BRCA2 gene.  #5 recent mammogram revealed calcifications in the left breast she is status post lumpectomy revealing a DCIS that was ER positive.  #6 patient is status post lumpectomy and now followed by radiation completed on 04/05/2013.  #7 patient will begin tamoxifen 20 mg daily for a total of 5-10 years. Risks and benefits of this were explained to the patient. She will begin 06/12/2013  CURRENT THERAPY: Tamoxifen 20 mg daily starting 06/12/2013  INTERVAL HISTORY: Michelle Wagner 47 y.o.  female returns for followup visit today. Overall patient is doing well. She denies any fevers chills night sweats headaches shortness of breath chest pains palpitations her neuropathic pain in the left breast at the surgical site is significantly improved. She remains on Neurontin at bedtime Remainder of the 10 point review of systems is negative.  MEDICAL HISTORY: Past Medical History  Diagnosis Date  . Breast cancer 10/12/2009    right lumpectomy herpos ert neg dcis multilocal invasionN0M0 rs herceptin radiation 2011  . H/O gastroesophageal reflux (GERD)     endoscopy  . Hx of migraines   . DCIS (ductal carcinoma in situ) of breast 01/09/13    left   . History of radiation therapy 04/06/10-05/18/10    right breast  . PONV (postoperative nausea and vomiting)   . GERD (gastroesophageal reflux disease)   . Headache(784.0)   . History of radiation therapy 03/07/13-04/05/13    left breast/ 52.72 total Gy/1fx    ALLERGIES:  has No Known Allergies.  MEDICATIONS:  Current Outpatient Prescriptions  Medication Sig Dispense Refill  . b complex vitamins capsule Take 1 capsule by mouth daily.      . Calcium Carbonate-Vitamin D (CALCIUM + D PO) Take 1 tablet by mouth daily.      . Cholecalciferol (VITAMIN D3) 1000 UNITS CAPS Take 1 capsule by mouth daily.      Marland Kitchen gabapentin (NEURONTIN) 100 MG capsule Take 1 capsule (100 mg total) by mouth 3 (three) times daily.  90 capsule  6  . Multiple Vitamin (MULTIVITAMIN) tablet Take 1 tablet by mouth daily.      . non-metallic deodorant Thornton Papas) MISC Apply  1 application topically daily as needed.      . tamoxifen (NOLVADEX) 20 MG tablet Take 1 tablet (20 mg total) by mouth daily.  90 tablet  12  . acetaminophen (TYLENOL) 325 MG tablet Take 325 mg by mouth daily as needed (for headache.).      Marland Kitchen ibuprofen (ADVIL,MOTRIN) 200 MG tablet Take 200 mg by mouth every 6 (six) hours as needed for headache.      Marland Kitchen LORazepam (ATIVAN) 0.5 MG tablet Take 1 tablet (0.5 mg  total) by mouth every 8 (eight) hours.  30 tablet  0   No current facility-administered medications for this visit.    SURGICAL HISTORY:  Past Surgical History  Procedure Laterality Date  . Abdominal hysterectomy  2008  . Breast lumpectomy  2010    right  . Breast lumpectomy with needle localization Left 02/06/2013    Procedure: BREAST LUMPECTOMY WITH NEEDLE LOCALIZATION;  Surgeon: Michelle Lint, MD;  Location: [facility] OR;  Service: General;  Laterality: Left;  left breast needle localization lumpectomy    REVIEW OF SYSTEMS:  Pertinent items are noted in HPI.   PHYSICAL EXAMINATION: General appearance: alert, cooperative and appears stated age Head: Normocephalic, without obvious abnormality, atraumatic Neck: no adenopathy, no carotid bruit, no JVD, supple, symmetrical, trachea midline and thyroid not enlarged, symmetric, no tenderness/mass/nodules Lymph nodes: Cervical, supraclavicular, and axillary nodes normal. Resp: clear to auscultation bilaterally and normal percussion bilaterally Back: symmetric, no curvature. ROM normal. No CVA tenderness. Cardio: regular rate and rhythm, S1, S2 normal, no murmur, click, rub or gallop and normal apical impulse GI: soft, non-tender; bowel sounds normal; no masses,  no organomegaly Extremities: extremities normal, atraumatic, no cyanosis or edema Neurologic: Alert and oriented X 3, normal strength and tone. Normal symmetric reflexes. Normal coordination and gait Right breast scar looks well-healed no nodularity no nipple discharge. Left breast excisional scar looks healing there is tenderness the breast is swollen. ECOG PERFORMANCE STATUS: 0 - Asymptomatic  Blood pressure 103/68, pulse 72, temperature 98.1 F (36.7 C), temperature source Oral, resp. rate 20, height 5\' 4"  (1.626 m), weight 125 lb 8 oz (56.926 kg).  LABORATORY DATA: Lab Results  Component Value Date   WBC 6.3 10/11/2013   HGB 12.9 10/11/2013   HCT 38.7 10/11/2013   MCV 89.1  10/11/2013   PLT 199 10/11/2013      Chemistry      Component Value Date/Time   NA 138 06/12/2013 1150   NA 141 09/27/2012 0841   K 4.3 06/12/2013 1150   K 4.6 09/27/2012 0841   CL 106 09/27/2012 0841   CO2 26 06/12/2013 1150   CO2 28 09/27/2012 0841   BUN 15.0 06/12/2013 1150   BUN 13 09/27/2012 0841   CREATININE 0.8 06/12/2013 1150   CREATININE 0.7 09/27/2012 0841      Component Value Date/Time   CALCIUM 9.5 06/12/2013 1150   CALCIUM 9.3 09/27/2012 0841   ALKPHOS 50 06/12/2013 1150   ALKPHOS 50 09/27/2012 0841   AST 17 06/12/2013 1150   AST 18 09/27/2012 0841   ALT 14 06/12/2013 1150   ALT 13 09/27/2012 0841   BILITOT 0.54 06/12/2013 1150   BILITOT 0.7 09/27/2012 0841       RADIOGRAPHIC STUDIES:  No results found.  ASSESSMENT: A very pleasant 47 year old female with :  1. stage I multifocal ER negative PR negative HER-2/neu positive invasive ductal carcinoma with associated DCIS of the right breast originally diagnosed in November 2010. She had a lumpectomy  with sentinel node biopsy at [facility] on 11/13/2009. The final pathology revealed multifocal invasive ductal carcinoma with 3 foci of tumor all of which measure less than 2 mm no LVI. Tumor was again ER negative PR negative HER-2/neu positive with a ratio of 5.28 all sentinel nodes were negative for metastatic disease. She has gone on to receive adjuvant systemic chemotherapy for consisting of Taxol and Herceptin from 12/19/2009 to March 2008 11. She has completed radiation therapy as of 05/18/2010. She tolerated all of her treatments very well. She has also had genetic testing performed for the BRCA1 and 2 gene mutation. Patient is without any evidence of recurrent disease.  #2 patient recently had a mammogram performed that showed calcifications in the left breast. She has gone on to have a lumpectomy. The final pathology revealed a ductal carcinoma in situ that was ER positive PR positive. There was no evidence of invasive  disease. She went on to complete radiation therapy post lumpectomy. This was administered from 03/08/2013 through 04/05/2013. She was then begun on tamoxifen 20 mg daily. On her next visit we will check FSH levels to see if she is postmenopausal.  #3 patient was also given Effexor for hot flashes unfortunately she could not tolerated this has been discontinued.  #4 neuropathic pain secondary to the lumpectomy: Patient is on gabapentin tolerating it well. She is taking this at nighttime with the tamoxifen.   PLAN:  #1 patient will continue tamoxifen 20 mg daily.  #2 continue gabapentin at nighttime.  #3 patient will be seen back in one years time for followup.  All questions were answered. The patient knows to call the clinic with any problems, questions or concerns. We can certainly see the patient much sooner if necessary.  I spent 25 minutes counseling the patient face to face. The total time spent in the appointment was 30 minutes.    Drue Second, MD Medical/Oncology [facility] [phone] (beeper) [phone] (Office)

## 2013-10-16 NOTE — Progress Notes (Signed)
Quick Note:  Please call patient:labs look normal ______

## 2014-02-07 ENCOUNTER — Other Ambulatory Visit: Payer: Self-pay | Admitting: Oncology

## 2014-02-07 DIAGNOSIS — M25559 Pain in unspecified hip: Secondary | ICD-10-CM

## 2014-02-07 MED ORDER — CELECOXIB 200 MG PO CAPS: 200.0000 mg | ORAL_CAPSULE | Freq: Two times a day (BID) | ORAL | Status: DC

## 2014-02-07 MED ORDER — DICLOFENAC SODIUM 1 % TD GEL: 4.0000 g | Freq: Four times a day (QID) | TRANSDERMAL | Status: DC

## 2014-02-10 ENCOUNTER — Other Ambulatory Visit: Payer: Self-pay | Admitting: *Deleted

## 2014-02-10 MED ORDER — CELECOXIB 200 MG PO CAPS: 200.0000 mg | ORAL_CAPSULE | Freq: Two times a day (BID) | ORAL | Status: DC

## 2014-02-10 MED ORDER — DICLOFENAC SODIUM 1 % TD GEL: 4.0000 g | Freq: Four times a day (QID) | TRANSDERMAL | Status: DC

## 2014-02-28 ENCOUNTER — Other Ambulatory Visit: Payer: Self-pay | Admitting: Oncology

## 2014-02-28 DIAGNOSIS — Z853 Personal history of malignant neoplasm of breast: Secondary | ICD-10-CM

## 2014-03-11 ENCOUNTER — Ambulatory Visit
Admission: RE | Admit: 2014-03-11 | Discharge: 2014-03-11 | Disposition: A | Discharge status: Home / Self Care | Payer: BC Managed Care – PPO | Source: Ambulatory Visit | Attending: Oncology | Admitting: Oncology

## 2014-03-11 DIAGNOSIS — Z853 Personal history of malignant neoplasm of breast: Secondary | ICD-10-CM

## 2014-03-13 ENCOUNTER — Telehealth: Payer: Self-pay

## 2014-03-13 NOTE — Telephone Encounter (Signed)
Message copied by Prentiss Bells on Thu Mar 13, 2014 11:54 AM ------      Message from: Deatra Robinson      Created: Wed Mar 12, 2014  4:38 PM       Mammogram no recurrent cancer ------

## 2014-03-14 NOTE — Telephone Encounter (Signed)
Per KK note below, let pt know mammogram showed no recurrent cancer.  Pt voiced understanding.

## 2014-03-21 ENCOUNTER — Ambulatory Visit: Payer: BC Managed Care – PPO | Admitting: Family Medicine

## 2014-03-26 ENCOUNTER — Ambulatory Visit (INDEPENDENT_AMBULATORY_CARE_PROVIDER_SITE_OTHER): Payer: BC Managed Care – PPO | Admitting: Family Medicine

## 2014-03-26 ENCOUNTER — Other Ambulatory Visit (INDEPENDENT_AMBULATORY_CARE_PROVIDER_SITE_OTHER): Payer: BC Managed Care – PPO

## 2014-03-26 DIAGNOSIS — Z0000 Encounter for general adult medical examination without abnormal findings: Secondary | ICD-10-CM

## 2014-03-26 DIAGNOSIS — Z111 Encounter for screening for respiratory tuberculosis: Secondary | ICD-10-CM

## 2014-03-26 LAB — LIPID PANEL
CHOL/HDL RATIO: 3
Cholesterol: 143 mg/dL (ref 0–200)
HDL: 53.8 mg/dL (ref >39.00)
LDL CALC: 65 mg/dL (ref 0–99)
Triglycerides: 119 mg/dL (ref 0.0–149.0)
VLDL: 23.8 mg/dL (ref 0.0–40.0)

## 2014-03-26 LAB — CBC WITH DIFFERENTIAL/PLATELET
BASOS ABS: 0 10*3/uL (ref 0.0–0.1)
Basophils Relative: 0.9 % (ref 0.0–3.0)
Eosinophils Absolute: 0.1 10*3/uL (ref 0.0–0.7)
Eosinophils Relative: 2.9 % (ref 0.0–5.0)
HCT: 38.8 % (ref 36.0–46.0)
Hemoglobin: 13.3 g/dL (ref 12.0–15.0)
Lymphocytes Relative: 31.3 % (ref 12.0–46.0)
Lymphs Abs: 1.4 10*3/uL (ref 0.7–4.0)
MCHC: 34.3 g/dL (ref 30.0–36.0)
MCV: 89.7 fl (ref 78.0–100.0)
MONOS PCT: 5.7 % (ref 3.0–12.0)
Monocytes Absolute: 0.3 10*3/uL (ref 0.1–1.0)
NEUTROS PCT: 59.2 % (ref 43.0–77.0)
Neutro Abs: 2.6 10*3/uL (ref 1.4–7.7)
PLATELETS: 172 10*3/uL (ref 150.0–400.0)
RBC: 4.32 Mil/uL (ref 3.87–5.11)
RDW: 11.8 % (ref 11.5–14.6)
WBC: 4.4 10*3/uL — ABNORMAL LOW (ref 4.5–10.5)

## 2014-03-26 LAB — BASIC METABOLIC PANEL
BUN: 11 mg/dL (ref 6–23)
CALCIUM: 9.2 mg/dL (ref 8.4–10.5)
CO2: 27 mEq/L (ref 19–32)
Chloride: 107 mEq/L (ref 96–112)
Creatinine, Ser: 0.7 mg/dL (ref 0.4–1.2)
GFR: 103.57 mL/min (ref >60.00)
GLUCOSE: 86 mg/dL (ref 70–99)
Potassium: 4.8 mEq/L (ref 3.5–5.1)
Sodium: 139 mEq/L (ref 135–145)

## 2014-03-26 LAB — HEPATIC FUNCTION PANEL
ALK PHOS: 42 U/L (ref 39–117)
ALT: 16 U/L (ref 0–35)
AST: 19 U/L (ref 0–37)
Albumin: 3.6 g/dL (ref 3.5–5.2)
BILIRUBIN DIRECT: 0.1 mg/dL (ref 0.0–0.3)
BILIRUBIN TOTAL: 0.6 mg/dL (ref 0.3–1.2)
Total Protein: 6.6 g/dL (ref 6.0–8.3)

## 2014-03-26 LAB — TSH: TSH: 2.54 u[IU]/mL (ref 0.35–5.50)

## 2014-04-02 ENCOUNTER — Ambulatory Visit (INDEPENDENT_AMBULATORY_CARE_PROVIDER_SITE_OTHER): Payer: BC Managed Care – PPO | Admitting: Internal Medicine

## 2014-04-02 ENCOUNTER — Encounter: Payer: Self-pay | Admitting: Internal Medicine

## 2014-04-02 VITALS — BP 106/70 | HR 73 | Temp 98.8°F | Ht 60.75 in | Wt 122.0 lb

## 2014-04-02 DIAGNOSIS — D0510 Intraductal carcinoma in situ of unspecified breast: Secondary | ICD-10-CM

## 2014-04-02 DIAGNOSIS — Z0000 Encounter for general adult medical examination without abnormal findings: Secondary | ICD-10-CM

## 2014-04-02 DIAGNOSIS — K5900 Constipation, unspecified: Secondary | ICD-10-CM | POA: Insufficient documentation

## 2014-04-02 DIAGNOSIS — D0590 Unspecified type of carcinoma in situ of unspecified breast: Secondary | ICD-10-CM

## 2014-04-02 DIAGNOSIS — Z923 Personal history of irradiation: Secondary | ICD-10-CM

## 2014-04-02 DIAGNOSIS — C50919 Malignant neoplasm of unspecified site of unspecified female breast: Secondary | ICD-10-CM

## 2014-04-02 MED ORDER — LUBIPROSTONE 24 MCG PO CAPS: 24.0000 ug | ORAL_CAPSULE | Freq: Two times a day (BID) | ORAL | Status: DC

## 2014-04-02 NOTE — Patient Instructions (Addendum)
Increase exercise and fluids . Will refer to Gi about the constipation problem Can try samples of amitiza in the interim.  rx wiuth coupon. Can temproarily stop the calcium  To see if makes a difference .    Constipation, Adult Constipation is when a person has fewer than 3 bowel movements a week; has difficulty having a bowel movement; or has stools that are dry, hard, or larger than normal. As people grow older, constipation is more common. If you try to fix constipation with medicines that make you have a bowel movement (laxatives), the problem may get worse. Long-term laxative use may cause the muscles of the colon to become weak. A low-fiber diet, not taking in enough fluids, and taking certain medicines may make constipation worse. CAUSES   Certain medicines, such as antidepressants, pain medicine, iron supplements, antacids, and water pills.   Certain diseases, such as diabetes, irritable bowel syndrome (IBS), thyroid disease, or depression.   Not drinking enough water.   Not eating enough fiber-rich foods.   Stress or travel.  Lack of physical activity or exercise.  Not going to the restroom when there is the urge to have a bowel movement.  Ignoring the urge to have a bowel movement.  Using laxatives too much. SYMPTOMS   Having fewer than 3 bowel movements a week.   Straining to have a bowel movement.   Having hard, dry, or larger than normal stools.   Feeling full or bloated.   Pain in the lower abdomen.  Not feeling relief after having a bowel movement. DIAGNOSIS  Your caregiver will take a medical history and perform a physical exam. Further testing may be done for severe constipation. Some tests may include:   A barium enema X-ray to examine your rectum, colon, and sometimes, your small intestine.  A sigmoidoscopy to examine your lower colon.  A colonoscopy to examine your entire colon. TREATMENT  Treatment will depend on the severity of your  constipation and what is causing it. Some dietary treatments include drinking more fluids and eating more fiber-rich foods. Lifestyle treatments may include regular exercise. If these diet and lifestyle recommendations do not help, your caregiver may recommend taking over-the-counter laxative medicines to help you have bowel movements. Prescription medicines may be prescribed if over-the-counter medicines do not work.  HOME CARE INSTRUCTIONS   Increase dietary fiber in your diet, such as fruits, vegetables, whole grains, and beans. Limit high-fat and processed sugars in your diet, such as [place] fries, hamburgers, cookies, candies, and soda.   A fiber supplement may be added to your diet if you cannot get enough fiber from foods.   Drink enough fluids to keep your urine clear or pale yellow.   Exercise regularly or as directed by your caregiver.   Go to the restroom when you have the urge to go. Do not hold it.  Only take medicines as directed by your caregiver. Do not take other medicines for constipation without talking to your caregiver first. [place] IF:   You have bright red blood in your stool.   Your constipation lasts for more than 4 days or gets worse.   You have abdominal or rectal pain.   You have thin, pencil-like stools.  You have unexplained weight loss. MAKE SURE YOU:   Understand these instructions.  Will watch your condition.  Will get help right away if you are not doing well or get worse. Document Released: 08/26/2004 Document Revised: 02/20/2012 Document Reviewed: 09/09/2013 [employer]  Patient Information 2014 [employer].  

## 2014-04-02 NOTE — Progress Notes (Signed)
Chief Complaint  Patient presents with  . Annual Exam    Complains of constipation.    HPI: Patient comes in today for Preventive Health Care visit  Pt comes in for preventive visit  Breast cancer sp rx rad and on tamoxifen   Sees Onc yearly now.   Hx of constipationfor at least a year battling with rx   and tried benefiber. And then  Colace plus and no help. 4per day.  And gasses.  About  A year.   Can go a week  Without stool if doesn't doe something  Gassy a lot.  No VD but got cramps with dulcolax and when tried miralax.  No hx of same problem   Mom has IBS no other gi disease.    Calcium and  Tamoxifem. Only meds  Had ppd placed last week and was to be read 3-4 days ago . family and she looked at it  After clinic closed that day no redness or swellings or induration  Health Maintenance  Topic Date Due  . Influenza Vaccine  07/12/2014  . Mammogram  03/12/2015  . Pap Smear  10/12/2016  . Tetanus/tdap  09/28/2020   Health Maintenance Review ROS:  GEN/ HEENT: No fever, significant weight changes sweats headaches vision problems hearing changes, CV/ PULM; No chest pain shortness of breath cough, syncope,edema  change in exercise tolerance. GI /GU: No adominal pain, vomiting, . No blood in the stool. No significant GU symptoms. See above  SKIN/HEME: ,no acute skin rashes suspicious lesions or bleeding. No lymphadenopathy, nodules, masses.  NEURO/ PSYCH:  No neurologic signs such as weakness numbness. No depression anxiety. IMM/ Allergy: No unusual infections.  Allergy .   REST of 12 system review negative except as per HPI   Past Medical History  Diagnosis Date  . Breast cancer 10/12/2009    right lumpectomy herpos ert neg dcis multilocal invasionN0M0 rs herceptin radiation 2011  . H/O gastroesophageal reflux (GERD)     endoscopy  . Hx of migraines   . DCIS (ductal carcinoma in situ) of breast 01/09/13    left   . History of radiation therapy 04/06/10-05/18/10   right breast  . PONV (postoperative nausea and vomiting)   . GERD (gastroesophageal reflux disease)   . Headache(784.0)   . History of radiation therapy 03/07/13-04/05/13    left breast/ 52.72 total Gy/28fx    Family History  Problem Relation Age of Onset  . Uterine cancer Maternal Aunt 88  . Thalassemia Cousin     several paternal cousins with Beta Thal  . Eating disorder Daughter   . Hyperthyroidism Mother   . Graves' disease Mother   . Diabetes Father   . Hypertension Father   . Hypertension Brother   . Hyperlipidemia Brother     one brother w/ hihg bp/ chol  . Breast cancer Maternal Aunt     single mastectomy, no chemo; dx in her 92s  . Stomach cancer Cousin 29    Klinefelter syndrome  . Klinefelter's syndrome Cousin   . Diabetes Mellitus II Cousin     History   Social History  . Marital Status: Married    Spouse Name: N/A    Number of Children: N/A  . Years of Education: N/A   Social History Main Topics  . Smoking status: Never Smoker   . Smokeless tobacco: Never Used  . Alcohol Use: No  . Drug Use: No  . Sexual Activity: Yes   Other Topics Concern  .  None   Social History Narrative   hh of  7  Husband children and MInLAW   Pet 1 cats.    Neg tobacco   Masters level education   G4P4   To work vol at hospital    Lots of driving her children around                    Outpatient Encounter Prescriptions as of 04/02/2014  Medication Sig  . acetaminophen (TYLENOL) 325 MG tablet Take 325 mg by mouth daily as needed (for headache.).  Marland Kitchen b complex vitamins capsule Take 1 capsule by mouth daily.  . Calcium Carbonate-Vitamin D (CALCIUM + D PO) Take 1 tablet by mouth daily.  . Cholecalciferol (VITAMIN D3) 1000 UNITS CAPS Take 1 capsule by mouth daily.  . diclofenac sodium (VOLTAREN) 1 % GEL Apply 4 g topically 4 (four) times daily.  Marland Kitchen gabapentin (NEURONTIN) 100 MG capsule Take 1 capsule (100 mg total) by mouth 3 (three) times daily.  Marland Kitchen ibuprofen  (ADVIL,MOTRIN) 200 MG tablet Take 200 mg by mouth every 6 (six) hours as needed for headache.  . Multiple Vitamin (MULTIVITAMIN) tablet Take 1 tablet by mouth daily.  . tamoxifen (NOLVADEX) 20 MG tablet Take 1 tablet (20 mg total) by mouth daily.  Marland Kitchen lubiprostone (AMITIZA) 24 MCG capsule Take 1 capsule (24 mcg total) by mouth 2 (two) times daily with a meal.  . [DISCONTINUED] celecoxib (CELEBREX) 200 MG capsule Take 1 capsule (200 mg total) by mouth 2 (two) times daily.  . [DISCONTINUED] LORazepam (ATIVAN) 0.5 MG tablet Take 1 tablet (0.5 mg total) by mouth every 8 (eight) hours.  . [DISCONTINUED] non-metallic deodorant Jethro Poling) MISC Apply 1 application topically daily as needed.    EXAM:  BP 106/70  Pulse 73  Temp(Src) 98.8 F (37.1 C) (Oral)  Ht 5' 0.75" (1.543 m)  Wt 122 lb (55.339 kg)  BMI 23.24 kg/m2  SpO2 99%  Body mass index is 23.24 kg/(m^2).  Physical Exam: Vital signs reviewed SEG:BTDV is a well-developed well-nourished alert cooperative    who appearsr stated age in no acute distress.  HEENT: normocephalic atraumatic , Eyes: PERRL EOM's full, conjunctiva clear, Nares: paten,t no deformity discharge or tenderness., Ears: no deformity EAC's clear TMs with normal landmarks. Mouth: clear OP, no lesions, edema.  Moist mucous membranes. Dentition in adequate repair. NECK: supple without masses, thyromegaly or bruits. CHEST/PULM:  Clear to auscultation and percussion breath sounds equal no wheeze , rales or rhonchi. No chest wall deformities or tenderness. CV: PMI is nondisplaced, S1 S2 no gallops, murmurs, rubs. Peripheral pulses are full without delay.No JVD .  Breast uneven texture right axill no masses  Surgery site  ABDOMEN: Bowel sounds normal nontender  No guard or rebound, no hepato splenomegal no CVA tenderness.  Extremtities:  No clubbing cyanosis or edema, no acute joint swelling or redness no focal atrophy NEURO:  Oriented x3, cranial nerves 3-12 appear to be intact, no  obvious focal weakness,gait within normal limits no abnormal reflexes or asymmetrical SKIN: No acute rashes normal turgor, color, no bruising or petechiae. Left arm no redness or induration at ppd site  PSYCH: Oriented, good eye contact, no obvious depression anxiety, cognition and judgment appear normal. LN: no cervical axillary inguinal adenopathy  Lab Results  Component Value Date   WBC 4.4* 03/26/2014   HGB 13.3 03/26/2014   HCT 38.8 03/26/2014   PLT 172.0 03/26/2014   GLUCOSE 86 03/26/2014   CHOL 143 03/26/2014  TRIG 119.0 03/26/2014   HDL 53.80 03/26/2014   LDLCALC 65 03/26/2014   ALT 16 03/26/2014   AST 19 03/26/2014   NA 139 03/26/2014   K 4.8 03/26/2014   CL 107 03/26/2014   CREATININE 0.7 03/26/2014   BUN 11 03/26/2014   CO2 27 03/26/2014   TSH 2.54 03/26/2014   INR 0.87 03/19/2010    ASSESSMENT AND PLAN:  Discussed the following assessment and plan:  Visit for preventive health examination  Unspecified constipation - has tired anumber of [employer]si [employer] exercise hydration amitiza as a trial  ref to GI  - Plan: Ambulatory referral to Gastroenterology  Breast cancer  DCIS (ductal carcinoma in situ) of breast  History of radiation therapy PPD  test negative read here late but no residual and medical family checked on time and no redness or swelling noted . Patient Care Team: Burnis Medin, MD as PCP - General Marvene Staff, MD as Attending Physician (Obstetrics and Gynecology) Deatra Robinson, MD (Hematology and Oncology) Patient Instructions  Increase exercise and fluids . Will refer to Gi about the constipation problem Can try samples of amitiza in the interim.  rx wiuth coupon. Can temproarily stop the calcium  To see if makes a difference .    Constipation, Adult Constipation is when a person has fewer than 3 bowel movements a week; has difficulty having a bowel movement; or has stools that are dry, hard, or larger than normal. As people grow older, constipation is more  common. If you try to fix constipation with medicines that make you have a bowel movement (laxatives), the problem may get worse. Long-term laxative use may cause the muscles of the colon to become weak. A low-fiber diet, not taking in enough fluids, and taking certain medicines may make constipation worse. CAUSES   Certain medicines, such as antidepressants, pain medicine, iron supplements, antacids, and water pills.   Certain diseases, such as diabetes, irritable bowel syndrome (IBS), thyroid disease, or depression.   Not drinking enough water.   Not eating enough fiber-rich foods.   Stress or travel.  Lack of physical activity or exercise.  Not going to the restroom when there is the urge to have a bowel movement.  Ignoring the urge to have a bowel movement.  Using laxatives too much. SYMPTOMS   Having fewer than 3 bowel movements a week.   Straining to have a bowel movement.   Having hard, dry, or larger than normal stools.   Feeling full or bloated.   Pain in the lower abdomen.  Not feeling relief after having a bowel movement. DIAGNOSIS  Your caregiver will take a medical history and perform a physical exam. Further testing may be done for severe constipation. Some tests may include:   A barium enema X-ray to examine your rectum, colon, and sometimes, your small intestine.  A sigmoidoscopy to examine your lower colon.  A colonoscopy to examine your entire colon. TREATMENT  Treatment will depend on the severity of your constipation and what is causing it. Some dietary treatments include drinking more fluids and eating more fiber-rich foods. Lifestyle treatments may include regular exercise. If these diet and lifestyle recommendations do not help, your caregiver may recommend taking over-the-counter laxative medicines to help you have bowel movements. Prescription medicines may be prescribed if over-the-counter medicines do not work.  HOME CARE INSTRUCTIONS    Increase dietary fiber in your diet, such as fruits, vegetables, whole grains, and beans. Limit high-fat and processed sugars  in your diet, such as [place] fries, hamburgers, cookies, candies, and soda.   A fiber supplement may be added to your diet if you cannot get enough fiber from foods.   Drink enough fluids to keep your urine clear or pale yellow.   Exercise regularly or as directed by your caregiver.   Go to the restroom when you have the urge to go. Do not hold it.  Only take medicines as directed by your caregiver. Do not take other medicines for constipation without talking to your caregiver first. [place] IF:   You have bright red blood in your stool.   Your constipation lasts for more than 4 days or gets worse.   You have abdominal or rectal pain.   You have thin, pencil-like stools.  You have unexplained weight loss. MAKE SURE YOU:   Understand these instructions.  Will watch your condition.  Will get help right away if you are not doing well or get worse. Document Released: 08/26/2004 Document Revised: 02/20/2012 Document Reviewed: 09/09/2013 [facility] Patient Information 2014 [place], [place].     Standley Brooking. Panosh M.D.     Pre visit review using our clinic review tool, if applicable. No additional management support is needed unless otherwise documented below in the visit note.

## 2014-07-25 ENCOUNTER — Telehealth: Payer: Self-pay

## 2014-07-25 NOTE — Telephone Encounter (Signed)
Consult notes from [place] 07/22/14 Dr. Collene Mares.  Copy to [place].  Original sent to scan.

## 2014-08-08 ENCOUNTER — Ambulatory Visit (INDEPENDENT_AMBULATORY_CARE_PROVIDER_SITE_OTHER): Payer: BC Managed Care – PPO | Admitting: Family Medicine

## 2014-08-08 DIAGNOSIS — Z111 Encounter for screening for respiratory tuberculosis: Secondary | ICD-10-CM

## 2014-08-11 LAB — TB SKIN TEST: TB Skin Test: NEGATIVE

## 2014-08-31 ENCOUNTER — Other Ambulatory Visit: Payer: Self-pay | Admitting: Oncology

## 2014-08-31 DIAGNOSIS — C50919 Malignant neoplasm of unspecified site of unspecified female breast: Secondary | ICD-10-CM

## 2014-09-06 ENCOUNTER — Telehealth: Payer: Self-pay | Admitting: Adult Health

## 2014-09-06 NOTE — Telephone Encounter (Signed)
, °

## 2014-10-09 ENCOUNTER — Other Ambulatory Visit: Payer: Self-pay | Admitting: *Deleted

## 2014-10-09 DIAGNOSIS — C50912 Malignant neoplasm of unspecified site of left female breast: Secondary | ICD-10-CM

## 2014-10-10 ENCOUNTER — Ambulatory Visit: Payer: BC Managed Care – PPO | Admitting: Adult Health

## 2014-10-10 ENCOUNTER — Ambulatory Visit: Payer: BC Managed Care – PPO | Admitting: Oncology

## 2014-10-10 ENCOUNTER — Other Ambulatory Visit: Payer: BC Managed Care – PPO

## 2014-10-13 ENCOUNTER — Encounter: Payer: Self-pay | Admitting: Internal Medicine

## 2015-02-12 ENCOUNTER — Other Ambulatory Visit: Payer: Self-pay | Admitting: Internal Medicine

## 2015-02-12 DIAGNOSIS — Z1231 Encounter for screening mammogram for malignant neoplasm of breast: Secondary | ICD-10-CM

## 2015-02-20 ENCOUNTER — Encounter: Payer: Self-pay | Admitting: Internal Medicine

## 2015-02-20 ENCOUNTER — Ambulatory Visit (INDEPENDENT_AMBULATORY_CARE_PROVIDER_SITE_OTHER): Payer: BLUE CROSS/BLUE SHIELD | Admitting: Internal Medicine

## 2015-02-20 VITALS — BP 96/66 | Temp 101.9°F | Ht 60.75 in | Wt 125.5 lb

## 2015-02-20 DIAGNOSIS — J1189 Influenza due to unidentified influenza virus with other manifestations: Secondary | ICD-10-CM

## 2015-02-20 DIAGNOSIS — J111 Influenza due to unidentified influenza virus with other respiratory manifestations: Secondary | ICD-10-CM

## 2015-02-20 LAB — POCT INFLUENZA A/B
Influenza A, POC: POSITIVE
Influenza B, POC: POSITIVE

## 2015-02-20 MED ORDER — OSELTAMIVIR PHOSPHATE 75 MG PO CAPS: 75.0000 mg | ORAL_CAPSULE | Freq: Two times a day (BID) | ORAL | Status: DC

## 2015-02-20 NOTE — Progress Notes (Signed)
Pre visit review using our clinic review tool, if applicable. No additional management support is needed unless otherwise documented below in the visit note.  Chief Complaint  Patient presents with  . Headache    Started yesterday.  . Fever  . Chills  . Generalized Body Aches  . Cough  . Nasal Congestion    HPI: Patient Michelle Wagner  comes in today for SDA for  new problem evaluation. Here with husband today . Acute onset  24 hours of  Myalgias and then  HA fever chills  And congestion  Alternating tylenol and ibu fever still up . Now has stuffy runny nose and cough no cp sob .   maialse no rash  Hemoptysis  No recnet travel Children visi from college  [facility] and [facility]  ROS: See pertinent positives and negatives per HPI.as per hpi   Past Medical History  Diagnosis Date  . Breast cancer 10/12/2009    right lumpectomy herpos ert neg dcis multilocal invasionN0M0 rs herceptin radiation 2011  . H/O gastroesophageal reflux (GERD)     endoscopy  . Hx of migraines   . DCIS (ductal carcinoma in situ) of breast 01/09/13    left   . History of radiation therapy 04/06/10-05/18/10    right breast  . PONV (postoperative nausea and vomiting)   . GERD (gastroesophageal reflux disease)   . Headache(784.0)   . History of radiation therapy 03/07/13-04/05/13    left breast/ 52.72 total Gy/25fx    Family History  Problem Relation Age of Onset  . Uterine cancer Maternal Aunt 88  . Thalassemia Cousin     several paternal cousins with Beta Thal  . Eating disorder Daughter   . Hyperthyroidism Mother   . Graves' disease Mother   . Diabetes Father   . Hypertension Father   . Hypertension Brother   . Hyperlipidemia Brother     one brother w/ hihg bp/ chol  . Breast cancer Maternal Aunt     single mastectomy, no chemo; dx in her 33s  . Stomach cancer Cousin 29    Klinefelter syndrome  . Klinefelter's syndrome Cousin   . Diabetes Mellitus II Cousin     History   Social History  . Marital  Status: Married    Spouse Name: N/A  . Number of Children: N/A  . Years of Education: N/A   Social History Main Topics  . Smoking status: Never Smoker   . Smokeless tobacco: Never Used  . Alcohol Use: No  . Drug Use: No  . Sexual Activity: Yes   Other Topics Concern  . None   Social History Narrative   hh of  7  Husband children and MInLAW   Pet 1 cats.    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 b complex vitamins capsule Take 1 capsule by mouth daily.  . Calcium Carbonate-Vitamin D (CALCIUM + D PO) Take 1 tablet by mouth daily.  . Cholecalciferol (VITAMIN D3) 1000 UNITS CAPS Take 1 capsule by mouth daily.  Marland Kitchen ibuprofen (ADVIL,MOTRIN) 200 MG tablet Take 200 mg by mouth every 6 (six) hours as needed for headache.  . tamoxifen (NOLVADEX) 20 MG tablet TAKE 1 TABLET BY MOUTH DAILY  .  acetaminophen (TYLENOL) 325 MG tablet Take 325 mg by mouth daily as needed (for headache.).  Marland Kitchen diclofenac sodium (VOLTAREN) 1 % GEL Apply 4 g topically 4 (four) times daily. (Patient not taking: Reported on 02/20/2015)  . gabapentin (NEURONTIN) 100 MG capsule Take 1 capsule (100 mg total) by mouth 3 (three) times daily. (Patient not taking: Reported on 02/20/2015)  . lubiprostone (AMITIZA) 24 MCG capsule Take 1 capsule (24 mcg total) by mouth 2 (two) times daily with a meal. (Patient not taking: Reported on 02/20/2015)  . oseltamivir (TAMIFLU) 75 MG capsule Take 1 capsule (75 mg total) by mouth 2 (two) times daily.  . [DISCONTINUED] Multiple Vitamin (MULTIVITAMIN) tablet Take 1 tablet by mouth daily.    EXAM:  BP 96/66 mmHg  Temp(Src) 101.9 F (38.8 C) (Oral)  Ht 5' 0.75" (1.543 m)  Wt 125 lb 8 oz (56.926 kg)  BMI 23.91 kg/m2  Body mass index is 23.91 kg/(m^2).  GENERAL: vitals reviewed and listed above, alert, oriented, appears well  hydrated and in no acute distress mod ill non toxic  resp nl  HEENT: atraumatic, conjunctiva  clear, no obvious abnormalities on inspection of external nose and ears tms clear OP : no lesion edema or exudate  Nares stuffy clear  NECK: no obvious masses on inspection palpation  LUNGS: clear to auscultation bilaterally, no wheezes, rales or rhonchi, good air movement CV: HRRR, no clubbing cyanosis or  peripheral edema nl cap refill  Abdomen:  Sof,t normal bowel sounds without hepatosplenomegaly, no guarding rebound or masses no CVA tenderness Skin nl cap refill  No rash  MS: moves all extremities without noticeable focal  Abnormality FLU SCREEN  FAINLY POSITIVE.  ASSESSMENT AND PLAN:   Discussed the following assessment and plan:  Influenza with respiratory manifestations - Plan: POCT Influenza A/B  -Patient advised to return or notify health care team  if symptoms worsen ,persist or new concerns arise.  Patient Instructions  This acts like influenza illness. Can try tamiflu mitigation does decrease the communicability of flu virus .  Influenza Influenza ("the flu") is a viral infection of the respiratory tract. It occurs more often in winter months because people spend more time in close contact with one another. Influenza can make you feel very sick. Influenza easily spreads from person to person (contagious). CAUSES  Influenza is caused by a virus that infects the respiratory tract. You can catch the virus by breathing in droplets from an infected person's cough or sneeze. You can also catch the virus by touching something that was recently contaminated with the virus and then touching your mouth, nose, or eyes. RISKS AND COMPLICATIONS You may be at risk for a more severe case of influenza if you smoke cigarettes, have diabetes, have chronic heart disease (such as heart failure) or lung disease (such as asthma), or if you have a weakened immune system. Elderly people and pregnant women  are also at risk for more serious infections. The most common problem of influenza is a lung infection (pneumonia). Sometimes, this problem can require emergency medical care and may be life threatening. SIGNS AND SYMPTOMS  Symptoms typically last 4 to 10 days and may include:  Fever.  Chills.  Headache, body aches, and muscle aches.  Sore throat.  Chest discomfort and cough.  Poor appetite.  Weakness or feeling tired.  Dizziness.  Nausea or vomiting. DIAGNOSIS  Diagnosis of influenza is often made based on your history and a physical exam. A nose or throat swab test  can be done to confirm the diagnosis. TREATMENT  In mild cases, influenza goes away on its own. Treatment is directed at relieving symptoms. For more severe cases, your health care provider may prescribe antiviral medicines to shorten the sickness. Antibiotic medicines are not effective because the infection is caused by a virus, not by bacteria. HOME CARE INSTRUCTIONS  Take medicines only as directed by your health care provider.  Use a cool mist humidifier to make breathing easier.  Get plenty of rest until your temperature returns to normal. This usually takes 3 to 4 days.  Drink enough fluid to keep your urine clear or pale yellow.  Cover yourmouth and nosewhen coughing or sneezing,and wash your handswellto prevent thevirusfrom spreading.  Stay homefromwork orschool untilthe fever is gonefor at least 58full day. PREVENTION  An annual influenza vaccination (flu shot) is the best way to avoid getting influenza. An annual flu shot is now routinely recommended for all adults in the [place] IF:  You experiencechest pain, yourcough worsens,or you producemore mucus.  Youhave nausea,vomiting, ordiarrhea.  Your fever returns or gets worse. SEEK IMMEDIATE MEDICAL CARE IF:  You havetrouble breathing, you become short of breath,or your skin ornails becomebluish.  You have  severe painor stiffnessin the neck.  You develop a sudden headache, or pain in the face or ear.  You have nausea or vomiting that you cannot control. MAKE SURE YOU:   Understand these instructions.  Will watch your condition.  Will get help right away if you are not doing well or get worse. Document Released: 11/25/2000 Document Revised: 04/14/2014 Document Reviewed: 02/27/2012 [facility] Patient Information 2015 [place], [place]. This information is not intended to replace advice given to you by your health care provider. Make sure you discuss any questions you have with your health care provider.      Standley Brooking. Livian Vanderbeck M.D.

## 2015-02-20 NOTE — Patient Instructions (Signed)
This acts like influenza illness. Can try tamiflu mitigation does decrease the communicability of flu virus .  Influenza Influenza ("the flu") is a viral infection of the respiratory tract. It occurs more often in winter months because people spend more time in close contact with one another. Influenza can make you feel very sick. Influenza easily spreads from person to person (contagious). CAUSES  Influenza is caused by a virus that infects the respiratory tract. You can catch the virus by breathing in droplets from an infected person's cough or sneeze. You can also catch the virus by touching something that was recently contaminated with the virus and then touching your mouth, nose, or eyes. RISKS AND COMPLICATIONS You may be at risk for a more severe case of influenza if you smoke cigarettes, have diabetes, have chronic heart disease (such as heart failure) or lung disease (such as asthma), or if you have a weakened immune system. Elderly people and pregnant women are also at risk for more serious infections. The most common problem of influenza is a lung infection (pneumonia). Sometimes, this problem can require emergency medical care and may be life threatening. SIGNS AND SYMPTOMS  Symptoms typically last 4 to 10 days and may include:  Fever.  Chills.  Headache, body aches, and muscle aches.  Sore throat.  Chest discomfort and cough.  Poor appetite.  Weakness or feeling tired.  Dizziness.  Nausea or vomiting. DIAGNOSIS  Diagnosis of influenza is often made based on your history and a physical exam. A nose or throat swab test can be done to confirm the diagnosis. TREATMENT  In mild cases, influenza goes away on its own. Treatment is directed at relieving symptoms. For more severe cases, your health care provider may prescribe antiviral medicines to shorten the sickness. Antibiotic medicines are not effective because the infection is caused by a virus, not by bacteria. HOME CARE  INSTRUCTIONS  Take medicines only as directed by your health care provider.  Use a cool mist humidifier to make breathing easier.  Get plenty of rest until your temperature returns to normal. This usually takes 3 to 4 days.  Drink enough fluid to keep your urine clear or pale yellow.  Cover yourmouth and nosewhen coughing or sneezing,and wash your handswellto prevent thevirusfrom spreading.  Stay homefromwork orschool untilthe fever is gonefor at least 15full day. PREVENTION  An annual influenza vaccination (flu shot) is the best way to avoid getting influenza. An annual flu shot is now routinely recommended for all adults in the [place] IF:  You experiencechest pain, yourcough worsens,or you producemore mucus.  Youhave nausea,vomiting, ordiarrhea.  Your fever returns or gets worse. SEEK IMMEDIATE MEDICAL CARE IF:  You havetrouble breathing, you become short of breath,or your skin ornails becomebluish.  You have severe painor stiffnessin the neck.  You develop a sudden headache, or pain in the face or ear.  You have nausea or vomiting that you cannot control. MAKE SURE YOU:   Understand these instructions.  Will watch your condition.  Will get help right away if you are not doing well or get worse. Document Released: 11/25/2000 Document Revised: 04/14/2014 Document Reviewed: 02/27/2012 [facility] Patient Information 2015 [place], [place]. This information is not intended to replace advice given to you by your health care provider. Make sure you discuss any questions you have with your health care provider.

## 2015-03-13 ENCOUNTER — Other Ambulatory Visit: Payer: Self-pay | Admitting: Internal Medicine

## 2015-03-13 DIAGNOSIS — Z853 Personal history of malignant neoplasm of breast: Secondary | ICD-10-CM

## 2015-03-16 ENCOUNTER — Ambulatory Visit
Admission: RE | Admit: 2015-03-16 | Discharge: 2015-03-16 | Disposition: A | Discharge status: Home / Self Care | Payer: BLUE CROSS/BLUE SHIELD | Source: Ambulatory Visit | Attending: Internal Medicine | Admitting: Internal Medicine

## 2015-03-16 DIAGNOSIS — Z853 Personal history of malignant neoplasm of breast: Secondary | ICD-10-CM

## 2015-03-17 ENCOUNTER — Telehealth: Payer: Self-pay

## 2015-03-17 NOTE — Telephone Encounter (Signed)
Mammogram reports dtd 03/16/15 rcvd from [facility].  REviewed by Dr. Lindi Adie.  SEnt to scan.

## 2015-07-13 ENCOUNTER — Telehealth: Payer: Self-pay | Admitting: Internal Medicine

## 2015-07-13 NOTE — Telephone Encounter (Addendum)
Pt needs her immunizations from dr Regis Bill faxed to her before noon today. Pt is in [address] [place] [employer] on a job interview and needs asap.  I will fax if you can print. Thanks.   Fax [phone]

## 2015-07-13 NOTE — Telephone Encounter (Signed)
immunizations have been faxed

## 2016-02-11 ENCOUNTER — Encounter: Payer: Self-pay | Admitting: Internal Medicine

## 2016-02-11 ENCOUNTER — Ambulatory Visit (INDEPENDENT_AMBULATORY_CARE_PROVIDER_SITE_OTHER): Payer: BLUE CROSS/BLUE SHIELD | Admitting: Internal Medicine

## 2016-02-11 VITALS — BP 100/66 | Temp 98.9°F | Wt 125.1 lb

## 2016-02-11 DIAGNOSIS — Z79899 Other long term (current) drug therapy: Secondary | ICD-10-CM | POA: Diagnosis not present

## 2016-02-11 DIAGNOSIS — L608 Other nail disorders: Secondary | ICD-10-CM

## 2016-02-11 DIAGNOSIS — Z79810 Long term (current) use of selective estrogen receptor modulators (SERMs): Secondary | ICD-10-CM | POA: Diagnosis not present

## 2016-02-11 DIAGNOSIS — B351 Tinea unguium: Secondary | ICD-10-CM | POA: Diagnosis not present

## 2016-02-11 DIAGNOSIS — R29898 Other symptoms and signs involving the musculoskeletal system: Secondary | ICD-10-CM | POA: Diagnosis not present

## 2016-02-11 LAB — CBC WITH DIFFERENTIAL/PLATELET
BASOS ABS: 0 10*3/uL (ref 0.0–0.1)
Basophils Relative: 0.8 % (ref 0.0–3.0)
Eosinophils Absolute: 0.1 10*3/uL (ref 0.0–0.7)
Eosinophils Relative: 2.4 % (ref 0.0–5.0)
HCT: 37 % (ref 36.0–46.0)
Hemoglobin: 12.8 g/dL (ref 12.0–15.0)
LYMPHS ABS: 2.1 10*3/uL (ref 0.7–4.0)
Lymphocytes Relative: 36.7 % (ref 12.0–46.0)
MCHC: 34.7 g/dL (ref 30.0–36.0)
MCV: 86.7 fl (ref 78.0–100.0)
MONO ABS: 0.3 10*3/uL (ref 0.1–1.0)
Monocytes Relative: 4.6 % (ref 3.0–12.0)
NEUTROS PCT: 55.5 % (ref 43.0–77.0)
Neutro Abs: 3.2 10*3/uL (ref 1.4–7.7)
PLATELETS: 193 10*3/uL (ref 150.0–400.0)
RBC: 4.26 Mil/uL (ref 3.87–5.11)
RDW: 12 % (ref 11.5–15.5)
WBC: 5.8 10*3/uL (ref 4.0–10.5)

## 2016-02-11 LAB — BASIC METABOLIC PANEL
BUN: 11 mg/dL (ref 6–23)
CALCIUM: 9.1 mg/dL (ref 8.4–10.5)
CO2: 28 mEq/L (ref 19–32)
Chloride: 106 mEq/L (ref 96–112)
Creatinine, Ser: 0.71 mg/dL (ref 0.40–1.20)
GFR: 92.81 mL/min (ref >60.00)
GLUCOSE: 78 mg/dL (ref 70–99)
Potassium: 3.6 mEq/L (ref 3.5–5.1)
Sodium: 140 mEq/L (ref 135–145)

## 2016-02-11 LAB — T4, FREE: Free T4: 0.83 ng/dL (ref 0.60–1.60)

## 2016-02-11 LAB — HEPATIC FUNCTION PANEL
ALK PHOS: 43 U/L (ref 39–117)
ALT: 12 U/L (ref 0–35)
AST: 14 U/L (ref 0–37)
Albumin: 3.7 g/dL (ref 3.5–5.2)
Bilirubin, Direct: 0.1 mg/dL (ref 0.0–0.3)
Total Bilirubin: 0.3 mg/dL (ref 0.2–1.2)
Total Protein: 6.6 g/dL (ref 6.0–8.3)

## 2016-02-11 LAB — TSH: TSH: 1.5 u[IU]/mL (ref 0.35–4.50)

## 2016-02-11 MED ORDER — EFINACONAZOLE 10 % EX SOLN: CUTANEOUS | Status: DC

## 2016-02-11 NOTE — Progress Notes (Signed)
Pre visit review using our clinic review tool, if applicable. No additional management support is needed unless otherwise documented below in the visit note.  Chief Complaint  Patient presents with  . Nail Problem    Fungus on both great toes.    HPI: Patient Michelle Wagner  comes in today  for  new problem evaluation.  righ toe for 12 months and now lefr great toe for 3 months with  Discoloration and thickening without specific trauma   Used topical home remedies and no help  . Medial right great toe with num feeling no pain no trauma.  On tamoxifen fam hx of thyroid  Graves   fingernaisl more dry and brittle   No skin change  Generally well otherwise   ROS: See pertinent positives and negatives per HPI. No other tingling numbness   Past Medical History  Diagnosis Date  . Breast cancer ([place]) 10/12/2009    right lumpectomy herpos ert neg dcis multilocal invasionN0M0 rs herceptin radiation 2011  . H/O gastroesophageal reflux (GERD)     endoscopy  . Hx of migraines   . DCIS (ductal carcinoma in situ) of breast 01/09/13    left   . History of radiation therapy 04/06/10-05/18/10    right breast  . PONV (postoperative nausea and vomiting)   . GERD (gastroesophageal reflux disease)   . Headache(784.0)   . History of radiation therapy 03/07/13-04/05/13    left breast/ 52.72 total Gy/84fx    Family History  Problem Relation Age of Onset  . Uterine cancer Maternal Aunt 88  . Thalassemia Cousin     several paternal cousins with Beta Thal  . Eating disorder Daughter   . Hyperthyroidism Mother   . Graves' disease Mother   . Diabetes Father   . Hypertension Father   . Hypertension Brother   . Hyperlipidemia Brother     one brother w/ hihg bp/ chol  . Breast cancer Maternal Aunt     single mastectomy, no chemo; dx in her 61s  . Stomach cancer Cousin 29    Klinefelter syndrome  . Klinefelter's syndrome Cousin   . Diabetes Mellitus II Cousin     Social History   Social History  .  Marital Status: Married    Spouse Name: N/A  . Number of Children: N/A  . Years of Education: N/A   Social History Main Topics  . Smoking status: Never Smoker   . Smokeless tobacco: Never Used  . Alcohol Use: No  . Drug Use: No  . Sexual Activity: Yes   Other Topics Concern  . None   Social History Narrative   hh of  7  Husband children and MInLAW   Pet 1 cats.    Neg tobacco   Masters level education   G4P4   To work vol at hospital    Lots of driving her children around                    Outpatient Prescriptions Prior to Visit  Medication Sig Dispense Refill  . acetaminophen (TYLENOL) 325 MG tablet Take 325 mg by mouth daily as needed (for headache.).    Marland Kitchen b complex vitamins capsule Take 1 capsule by mouth daily.    . Calcium Carbonate-Vitamin D (CALCIUM + D PO) Take 1 tablet by mouth daily.    . Cholecalciferol (VITAMIN D3) 1000 UNITS CAPS Take 1 capsule by mouth daily.    Marland Kitchen ibuprofen (ADVIL,MOTRIN) 200 MG tablet Take 200 mg  by mouth every 6 (six) hours as needed for headache.    . tamoxifen (NOLVADEX) 20 MG tablet TAKE 1 TABLET BY MOUTH DAILY 90 tablet 0  . diclofenac sodium (VOLTAREN) 1 % GEL Apply 4 g topically 4 (four) times daily. (Patient not taking: Reported on 02/20/2015) 3 Tube 6  . gabapentin (NEURONTIN) 100 MG capsule Take 1 capsule (100 mg total) by mouth 3 (three) times daily. (Patient not taking: Reported on 02/20/2015) 90 capsule 6  . lubiprostone (AMITIZA) 24 MCG capsule Take 1 capsule (24 mcg total) by mouth 2 (two) times daily with a meal. (Patient not taking: Reported on 02/20/2015) 60 capsule 2  . oseltamivir (TAMIFLU) 75 MG capsule Take 1 capsule (75 mg total) by mouth 2 (two) times daily. 10 capsule 0   No facility-administered medications prior to visit.     EXAM:  BP 100/66 mmHg  Temp(Src) 98.9 F (37.2 C) (Oral)  Wt 125 lb 1.6 oz (56.745 kg)  Body mass index is 23.83 kg/(m^2).  GENERAL: vitals reviewed and listed above, alert,  oriented, appears well hydrated and in no acute distress HEENT: atraumatic, conjunctiva  clear, no obvious abnormalities on inspection of external nose and earsNECK: no obvious masses on inspection palpation  Some ? Stare   CV: HRRR, no clubbing cyanosis or  peripheral edema nl cap refill  MS: moves all extremities without noticeable focal  Abnormality Feet right great toe with 4 mm lateral green yellow thickened nail withyotu swelling   Less on left but in same symmetrical area.   Sensation seems normal x area defined samll lateral toe   PSYCH: pleasant and cooperative, no obvious depression or anxiety Lab Results  Component Value Date   WBC 5.8 02/11/2016   HGB 12.8 02/11/2016   HCT 37.0 02/11/2016   PLT 193.0 02/11/2016   GLUCOSE 78 02/11/2016   CHOL 143 03/26/2014   TRIG 119.0 03/26/2014   HDL 53.80 03/26/2014   LDLCALC 65 03/26/2014   ALT 12 02/11/2016   AST 14 02/11/2016   NA 140 02/11/2016   K 3.6 02/11/2016   CL 106 02/11/2016   CREATININE 0.71 02/11/2016   BUN 11 02/11/2016   CO2 28 02/11/2016   TSH 1.50 02/11/2016   INR 0.87 03/19/2010    ASSESSMENT AND PLAN:  Discussed the following assessment and plan:  Onychomycosis - IA with lamisil and tamoxifen try topical  consdier see derm   Change in nail appearance - fingers may jsut be age and drying check labs. - Plan: Basic metabolic panel, CBC with Differential/Platelet, Hepatic function panel, TSH, T4, free  Use of tamoxifen (Nolvadex)  Medication management - Plan: Basic metabolic panel, CBC with Differential/Platelet, Hepatic function panel, TSH, T4, free prob onychomycosis    Lamisil   -Patient advised to return or notify health care team  if symptoms worsen ,persist or new concerns arise.  Patient Instructions  Trial topical  jublia   lamisil can interact with tamoxifen   So not first line at ths time .  Will notify you  of labs when available. considier seeing dermatology also   Nail Ringworm A fungal  infection of the nail (tinea unguium/onychomycosis) is common. It is common as the visible part of the nail is composed of dead cells which have no blood supply to help prevent infection. It occurs because fungi are everywhere and will pick any opportunity to grow on any dead material. Because nails are very slow growing they require up to 2 years of treatment  with anti-fungal medications. The entire nail back to the base is infected. This includes approximately  of the nail which you cannot see. If your caregiver has prescribed a medication by mouth, take it every day and as directed. No progress will be seen for at least 6 to 9 months. Do not be disappointed! Because fungi live on dead cells with little or no exposure to blood supply, medication delivery to the infection is slow; thus the cure is slow. It is also why you can observe no progress in the first 6 months. The nail becoming cured is the base of the nail, as it has the blood supply. Topical medication such as creams and ointments are usually not effective. Important in successful treatment of nail fungus is closely following the medication regimen that your doctor prescribes. Sometimes you and your caregiver may elect to speed up this process by surgical removal of all the nails. Even this may still require 6 to 9 months of additional oral medications. See your caregiver as directed. Remember there will be no visible improvement for at least 6 months. See your caregiver sooner if other signs of infection (redness and swelling) develop.   This information is not intended to replace advice given to you by your health care provider. Make sure you discuss any questions you have with your health care provider.   Document Released: 11/25/2000 Document Revised: 04/14/2015 Document Reviewed: 06/01/2015 Elsevier Interactive Patient Education 2016 Harlan K. Khari Lett M.D.

## 2016-02-11 NOTE — Patient Instructions (Addendum)
Trial topical  jublia   lamisil can interact with tamoxifen   So not first line at ths time .  Will notify you  of labs when available. considier seeing dermatology also   Nail Ringworm A fungal infection of the nail (tinea unguium/onychomycosis) is common. It is common as the visible part of the nail is composed of dead cells which have no blood supply to help prevent infection. It occurs because fungi are everywhere and will pick any opportunity to grow on any dead material. Because nails are very slow growing they require up to 2 years of treatment with anti-fungal medications. The entire nail back to the base is infected. This includes approximately  of the nail which you cannot see. If your caregiver has prescribed a medication by mouth, take it every day and as directed. No progress will be seen for at least 6 to 9 months. Do not be disappointed! Because fungi live on dead cells with little or no exposure to blood supply, medication delivery to the infection is slow; thus the cure is slow. It is also why you can observe no progress in the first 6 months. The nail becoming cured is the base of the nail, as it has the blood supply. Topical medication such as creams and ointments are usually not effective. Important in successful treatment of nail fungus is closely following the medication regimen that your doctor prescribes. Sometimes you and your caregiver may elect to speed up this process by surgical removal of all the nails. Even this may still require 6 to 9 months of additional oral medications. See your caregiver as directed. Remember there will be no visible improvement for at least 6 months. See your caregiver sooner if other signs of infection (redness and swelling) develop.   This information is not intended to replace advice given to you by your health care provider. Make sure you discuss any questions you have with your health care provider.   Document Released: 11/25/2000 Document  Revised: 04/14/2015 Document Reviewed: 06/01/2015 Elsevier Interactive Patient Education [employer].

## 2016-02-12 ENCOUNTER — Telehealth: Payer: Self-pay | Admitting: Internal Medicine

## 2016-02-12 NOTE — Telephone Encounter (Signed)
Pt call to say she was rx the following med JUBLIA and insurance company said she need PA   Insurance bcbs   Case ref number [id number]

## 2016-02-15 ENCOUNTER — Other Ambulatory Visit: Payer: Self-pay

## 2016-02-15 MED ORDER — CICLOPIROX 8 % EX SOLN: Freq: Every day | CUTANEOUS | Status: DC

## 2016-02-15 NOTE — Telephone Encounter (Signed)
I attempted to submit PA - when I got a message stating  - using generic topical ciclopirox 8% nail solution (Penlac), oral terbinafine 250 mg tablets (Lamisil), or oral fluconazole tablets (Diflucan) as a generic therapeutic alternative to [place] or Kerydin in the treatment of onychomycosis, which would be covered by patient's insurance. I sent a message to Dr. Regis Bill asking if she wanted me to continue PA forJublia, or would one of these other medications be an option to treat the patient? Dr. Regis Bill prescribed Penlac. Patient notified of medication change and verbalized understanding. Medication sent to pharmacy.

## 2016-02-15 NOTE — Telephone Encounter (Signed)
Patient notified of Dr. Velora Mediate comments & the change in medication. Patient verbalized understanding.

## 2016-02-15 NOTE — Telephone Encounter (Signed)
Can try the topical penlac  First tell patient  The issue  She is on tamoxifen which has drug interaction with  Antifungals  lamisil  And thus a medical reason to not use this medication.

## 2016-02-15 NOTE — Telephone Encounter (Signed)
Michelle Wagner is not covered under patient formulary and needs a PA. When I tried to submit PA, I received a message saying to consider using generic topical ciclopirox 8% nail solution (Penlac), oral terbinafine 250 mg tablets (Lamisil), or oral fluconazole tablets (Diflucan) as a generic therapeutic alternative to [place] or Kerydin in the treatment of onychomycosis, which would be covered by patient's insurance. Would you like me to continue PA forJublia, or would one of these other medications be an option to treat the patient?

## 2016-02-23 ENCOUNTER — Encounter: Payer: Self-pay | Admitting: Family Medicine

## 2016-05-03 ENCOUNTER — Other Ambulatory Visit: Payer: Self-pay | Admitting: Internal Medicine

## 2016-05-03 DIAGNOSIS — Z853 Personal history of malignant neoplasm of breast: Secondary | ICD-10-CM

## 2016-05-11 ENCOUNTER — Other Ambulatory Visit: Payer: Self-pay | Admitting: Internal Medicine

## 2016-05-11 ENCOUNTER — Ambulatory Visit
Admission: RE | Admit: 2016-05-11 | Discharge: 2016-05-11 | Disposition: A | Discharge status: Home / Self Care | Payer: BLUE CROSS/BLUE SHIELD | Source: Ambulatory Visit | Attending: Internal Medicine | Admitting: Internal Medicine

## 2016-05-11 DIAGNOSIS — N6320 Unspecified lump in the left breast, unspecified quadrant: Secondary | ICD-10-CM

## 2016-05-11 DIAGNOSIS — Z853 Personal history of malignant neoplasm of breast: Secondary | ICD-10-CM

## 2016-06-11 ENCOUNTER — Other Ambulatory Visit: Payer: Self-pay | Admitting: Internal Medicine

## 2016-07-23 ENCOUNTER — Other Ambulatory Visit: Payer: Self-pay | Admitting: Oncology

## 2016-07-23 DIAGNOSIS — C50919 Malignant neoplasm of unspecified site of unspecified female breast: Secondary | ICD-10-CM

## 2016-09-14 MED FILL — TAMOXIFEN 20 MG TABLET: 20 | 90 days supply | Qty: 90 | Fill #0

## 2016-11-08 ENCOUNTER — Other Ambulatory Visit: Payer: Self-pay | Admitting: Internal Medicine

## 2016-11-08 DIAGNOSIS — N6489 Other specified disorders of breast: Secondary | ICD-10-CM

## 2016-11-16 ENCOUNTER — Ambulatory Visit
Admission: RE | Admit: 2016-11-16 | Discharge: 2016-11-16 | Disposition: A | Discharge status: Home / Self Care | Payer: BLUE CROSS/BLUE SHIELD | Source: Ambulatory Visit | Attending: Internal Medicine | Admitting: Internal Medicine

## 2016-11-16 DIAGNOSIS — N6489 Other specified disorders of breast: Secondary | ICD-10-CM

## 2016-11-16 DIAGNOSIS — R922 Inconclusive mammogram: Secondary | ICD-10-CM | POA: Diagnosis not present

## 2016-11-18 DIAGNOSIS — D6489 Other specified anemias: Secondary | ICD-10-CM | POA: Diagnosis not present

## 2016-11-18 DIAGNOSIS — Z170 Estrogen receptor positive status [ER+]: Secondary | ICD-10-CM | POA: Diagnosis not present

## 2016-11-18 DIAGNOSIS — C50812 Malignant neoplasm of overlapping sites of left female breast: Secondary | ICD-10-CM | POA: Diagnosis not present

## 2016-11-18 DIAGNOSIS — D509 Iron deficiency anemia, unspecified: Secondary | ICD-10-CM | POA: Diagnosis not present

## 2016-11-18 DIAGNOSIS — C50811 Malignant neoplasm of overlapping sites of right female breast: Secondary | ICD-10-CM | POA: Diagnosis not present

## 2016-11-18 DIAGNOSIS — K5909 Other constipation: Secondary | ICD-10-CM | POA: Diagnosis not present

## 2017-01-19 MED FILL — TAMOXIFEN 20 MG TABLET: 20 | 90 days supply | Qty: 90 | Fill #1

## 2017-06-26 MED FILL — TAMOXIFEN 20 MG TABLET: 20 | 90 days supply | Qty: 90 | Fill #2

## 2017-09-19 DIAGNOSIS — H524 Presbyopia: Secondary | ICD-10-CM | POA: Diagnosis not present

## 2017-09-19 DIAGNOSIS — H52223 Regular astigmatism, bilateral: Secondary | ICD-10-CM | POA: Diagnosis not present

## 2017-09-19 DIAGNOSIS — H5213 Myopia, bilateral: Secondary | ICD-10-CM | POA: Diagnosis not present

## 2017-11-29 MED FILL — TAMOXIFEN CITRATE 20 MG TAB: 20 | 90 days supply | Qty: 90 | Fill #0

## 2017-12-01 IMAGING — MG 2D DIGITAL DIAGNOSTIC UNILATERAL LEFT MAMMOGRAM WITH CAD AND ADJ
5 series · 6 of 9 positions shown · non-contrast
Comparison: Previous exams including most recent diagnostic
mammogram dated 05/11/2016.

CLINICAL DATA: Six-month follow-up for probably benign left breast
asymmetry.

History of bilateral breast cancers, status post right lumpectomy in
5454 and left lumpectomy in 2450.
EXAM:
2D DIGITAL DIAGNOSTIC UNILATERAL LEFT MAMMOGRAM WITH CAD AND ADJUNCT
TOMO

[L MLO synth-2D]
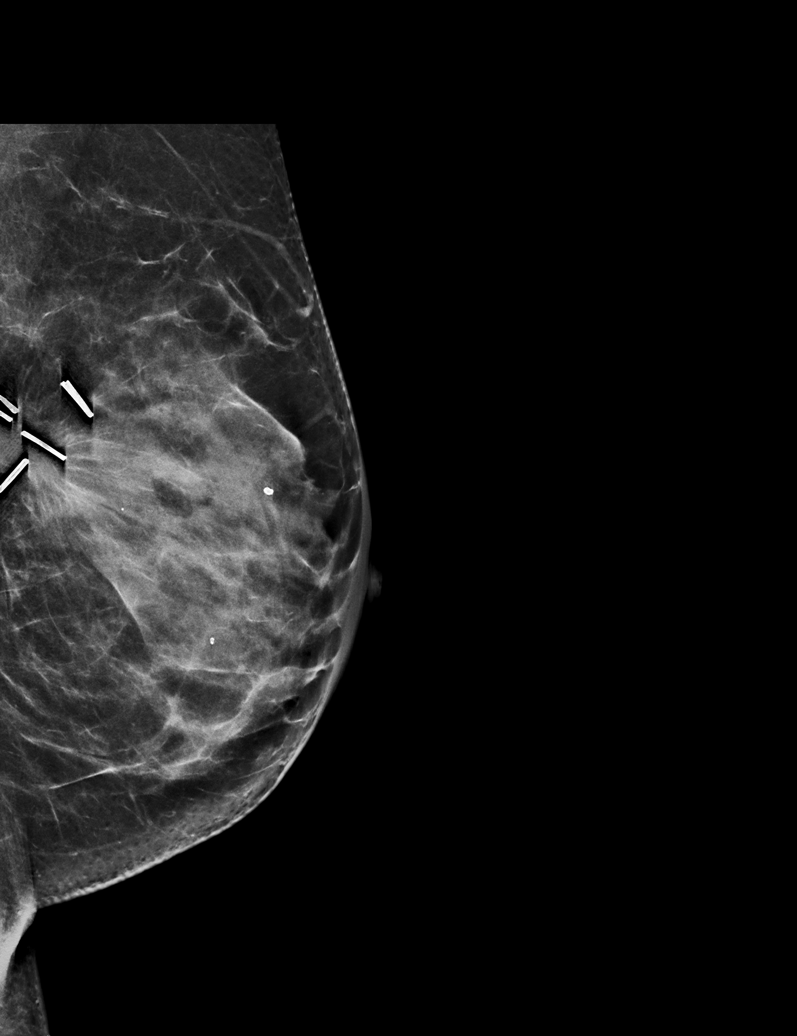

[L CC]
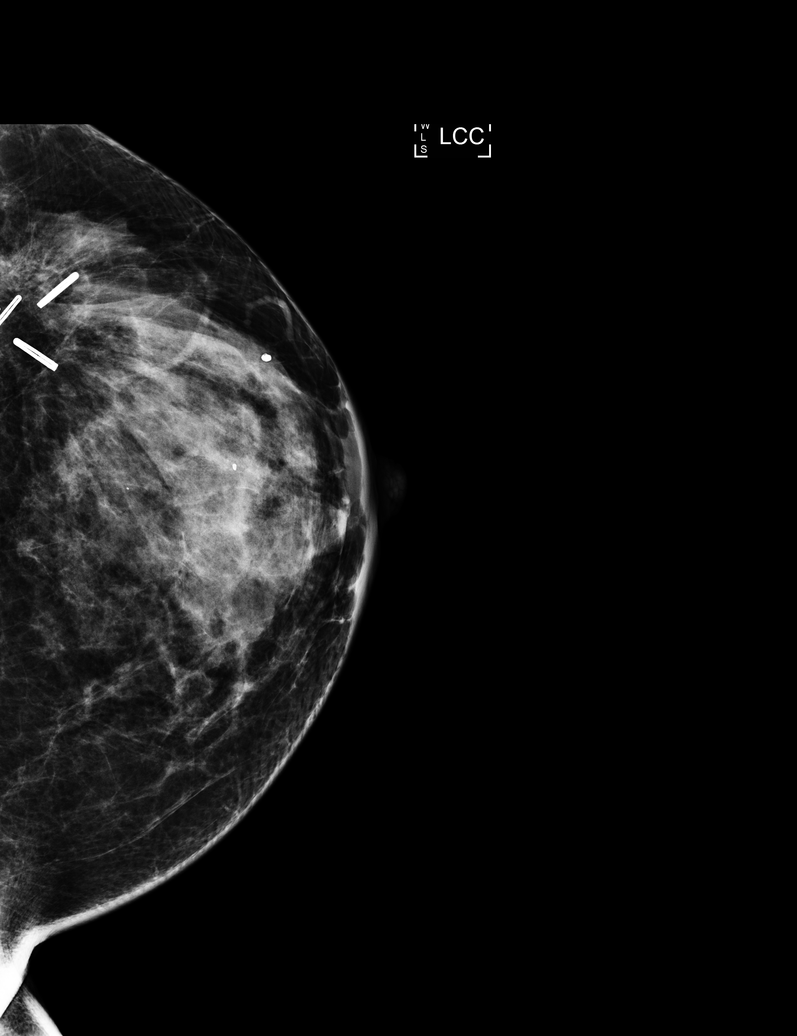

[L MLO]
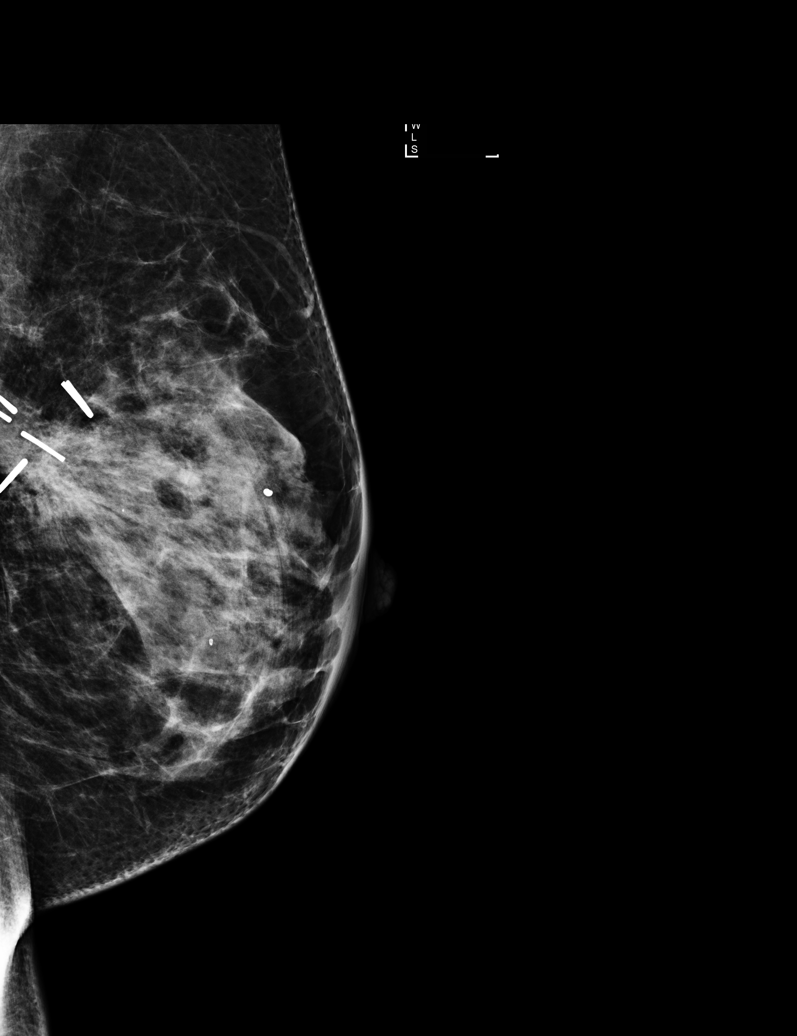

[L CC synth-2D]
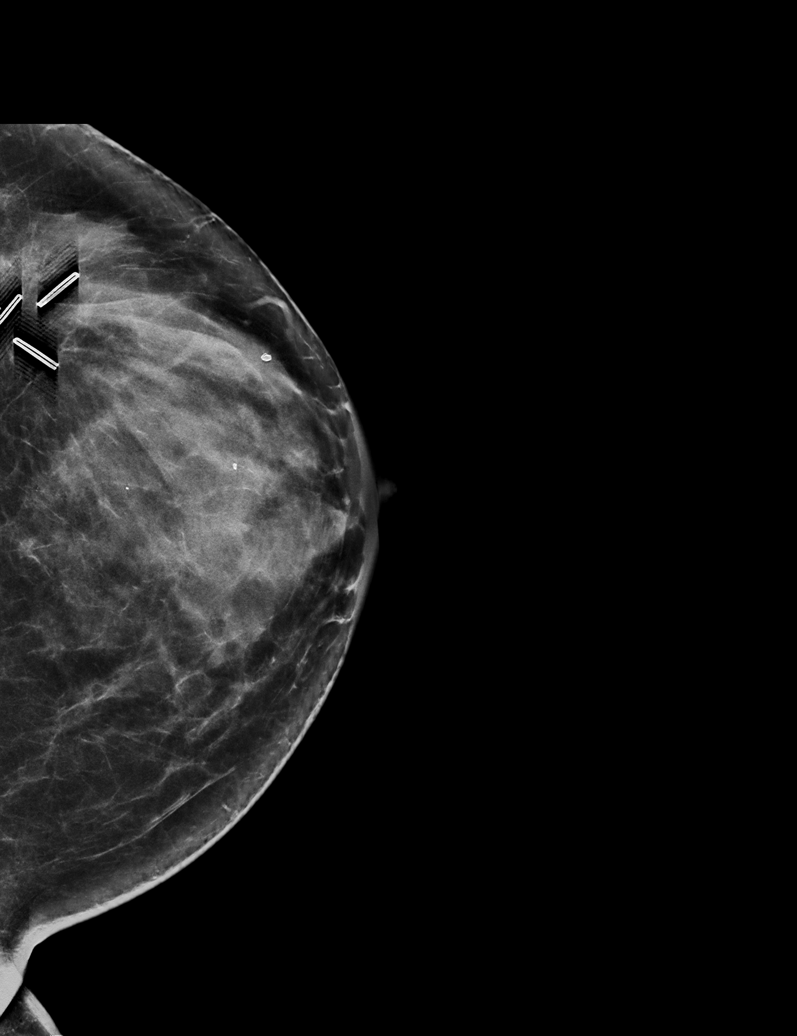

[L MLO tomo · 2 of 66 frames shown]
[frame 22/66]
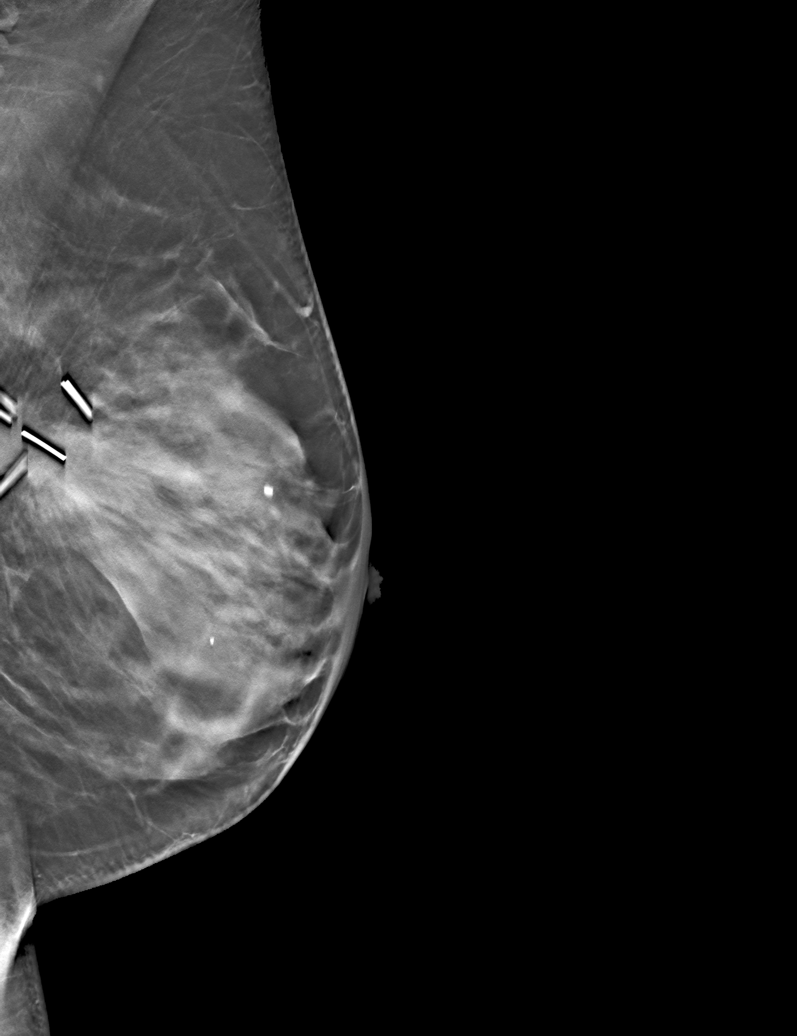
[frame 33/66]
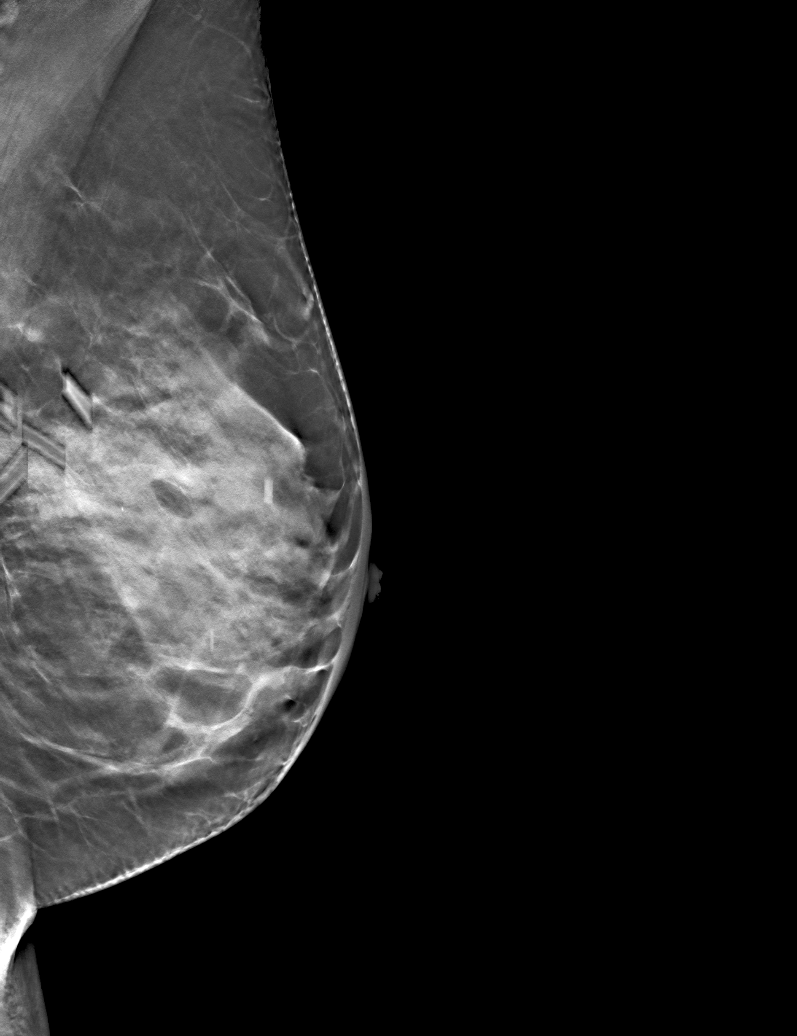

[6 of 9 positions shown; findings below may reference images not displayed]

ACR Breast Density Category c: The breast tissue is heterogeneously
dense, which may obscure small masses.
FINDINGS: The questioned asymmetry within the outer left breast on the
previous exam is no longer seen, indicating superimposition of
normal fibroglandular tissues. There are stable postsurgical changes
within the outer left breast. There are no new dominant masses,
suspicious calcifications or secondary signs of malignancy within
the left breast on today's exam.

Mammographic images were processed with CAD.
IMPRESSION: No evidence of malignancy. Stable postsurgical changes within the
left breast.

Patient may return to routine annual bilateral mammogram schedule,
to commence in 6 months in conjunction with patient's routine right
breast mammogram schedule.

RECOMMENDATION:
Bilateral diagnostic mammogram in 6 months.

I have discussed the findings and recommendations with the patient.
Results were also provided in writing at the conclusion of the
visit. If applicable, a reminder letter will be sent to the patient
regarding the next appointment.

BI-RADS CATEGORY  2: Benign.

## 2018-04-20 MED FILL — TAMOXIFEN CITRATE 20 MG TAB: 20 | 90 days supply | Qty: 90 | Fill #1

## 2018-08-22 MED FILL — TAMOXIFEN CITRATE 20 MG TAB: 20 | 90 days supply | Qty: 90 | Fill #2

## 2019-01-08 ENCOUNTER — Ambulatory Visit (INDEPENDENT_AMBULATORY_CARE_PROVIDER_SITE_OTHER): Payer: 59 | Admitting: Family

## 2019-01-08 ENCOUNTER — Encounter: Payer: Self-pay | Admitting: Family

## 2019-01-08 ENCOUNTER — Other Ambulatory Visit: Payer: Self-pay

## 2019-01-08 DIAGNOSIS — Z7184 Encounter for health counseling related to travel: Secondary | ICD-10-CM | POA: Insufficient documentation

## 2019-01-08 DIAGNOSIS — Z789 Other specified health status: Secondary | ICD-10-CM | POA: Diagnosis not present

## 2019-01-08 DIAGNOSIS — Z9189 Other specified personal risk factors, not elsewhere classified: Secondary | ICD-10-CM | POA: Diagnosis not present

## 2019-01-08 DIAGNOSIS — Z23 Encounter for immunization: Secondary | ICD-10-CM | POA: Diagnosis not present

## 2019-01-08 DIAGNOSIS — Z7189 Other specified counseling: Secondary | ICD-10-CM

## 2019-01-08 DIAGNOSIS — Z298 Encounter for other specified prophylactic measures: Secondary | ICD-10-CM

## 2019-01-08 DIAGNOSIS — Z7185 Encounter for immunization safety counseling: Secondary | ICD-10-CM

## 2019-01-08 MED ORDER — ATOVAQUONE-PROGUANIL HCL 250-100 MG PO TABS: ORAL_TABLET | ORAL | 0 refills | Status: DC

## 2019-01-08 MED FILL — ATOVAQUONE-PROGUANIL 250-10: 250-100 | 28 days supply | Qty: 28 | Fill #0

## 2019-01-08 NOTE — Progress Notes (Signed)
Subjective:    Patient ID: Michelle Wagner, female    DOB: 1966-07-14, 53 y.o.   MRN: [id number]  Chief Complaint  Patient presents with  . Travel Consult    [place] 2/7-2/23    HPI:  Michelle Wagner is a 53 y.o. female who presents today for travel consultation.  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 They are planning on staying in private homes as well as hotels during her stay. She has travelled extensively outside the [place] in the past. Due for Typhoid and Hepatitis A vaccination as well as Malaria prevention. She will get Travelers' Diarrhea treatment in [place] if she needs it.   No Known Allergies    Outpatient Medications Prior to Visit  Medication Sig Dispense Refill  . acetaminophen (TYLENOL) 325 MG tablet Take 325 mg by mouth daily as needed (for headache.).    Marland Kitchen b complex vitamins capsule Take 1 capsule by mouth daily.    . Calcium Carbonate-Vitamin D (CALCIUM + D PO) Take 1 tablet by mouth daily.    . Cholecalciferol (VITAMIN D3) 1000 UNITS CAPS Take 1 capsule by mouth daily.    . ciclopirox (PENLAC) 8 % solution SEE NOTES 6.6 mL 0  . Efinaconazole 10 % SOLN apply TOPICALLY to affected toenails once daily for 48 weeks; completely cover the toenail, including folds, bed, hyponychium, and undersurface of toenail plate with each application 4 mL 1  . ibuprofen (ADVIL,MOTRIN) 200 MG tablet Take 200 mg by mouth every 6 (six) hours as needed for headache.    . tamoxifen (NOLVADEX) 20 MG tablet TAKE 1 TABLET BY MOUTH DAILY 90 tablet 0   No facility-administered medications prior to visit.      Past Medical History:  Diagnosis Date  . Breast cancer ([place]) 10/12/2009   right lumpectomy herpos ert neg dcis multilocal invasionN0M0 rs herceptin radiation 2011  . DCIS (ductal carcinoma in situ) of breast 01/09/13   left   . GERD (gastroesophageal reflux disease)   . H/O gastroesophageal reflux  (GERD)    endoscopy  . Headache(784.0)   . History of radiation therapy 04/06/10-05/18/10   right breast  . History of radiation therapy 03/07/13-04/05/13   left breast/ 52.72 total Gy/6fx  . Hx of migraines   . PONV (postoperative nausea and vomiting)       Past Surgical History:  Procedure Laterality Date  . ABDOMINAL HYSTERECTOMY  2008  . BREAST LUMPECTOMY  2010   right  . BREAST LUMPECTOMY WITH NEEDLE LOCALIZATION Left 02/06/2013   Procedure: BREAST LUMPECTOMY WITH NEEDLE LOCALIZATION;  Surgeon: Stark Klein, MD;  Location: [facility];  Service: General;  Laterality: Left;  left breast needle localization lumpectomy      Family History  Problem Relation Age of Onset  . Uterine cancer Maternal Aunt 88  . Thalassemia Cousin        several paternal cousins with Beta Thal  . Eating disorder Daughter   . Hyperthyroidism Mother   . Graves' disease Mother   . Diabetes Father   . Hypertension Father   . Hypertension Brother   . Hyperlipidemia Brother        one brother w/ hihg bp/ chol  . Breast cancer Maternal Aunt        single mastectomy, no chemo; dx in her 51s  . Stomach cancer Cousin 29       Klinefelter syndrome  . Klinefelter's syndrome Cousin   .  Diabetes Mellitus II Cousin       Social History   Socioeconomic History  . Marital status: Married    Spouse name: Not on file  . Number of children: Not on file  . Years of education: Not on file  . Highest education level: Not on file  Occupational History  . Not on file  Social Needs  . Financial resource strain: Not on file  . Food insecurity:    Worry: Not on file    Inability: Not on file  . Transportation needs:    Medical: Not on file    Non-medical: Not on file  Tobacco Use  . Smoking status: Never Smoker  . Smokeless tobacco: Never Used  Substance and Sexual Activity  . Alcohol use: No  . Drug use: No  . Sexual activity: Yes  Lifestyle  . Physical activity:    Days per week: Not on file     Minutes per session: Not on file  . Stress: Not on file  Relationships  . Social connections:    Talks on phone: Not on file    Gets together: Not on file    Attends religious service: Not on file    Active member of club or organization: Not on file    Attends meetings of clubs or organizations: Not on file    Relationship status: Not on file  . Intimate partner violence:    Fear of current or ex partner: Not on file    Emotionally abused: Not on file    Physically abused: Not on file    Forced sexual activity: Not on file  Other Topics Concern  . Not on file  Social History Narrative   hh of  7  Husband children and MInLAW   Pet 1 cats.    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 Nursing note and vital signs reviewed.  Physical Exam Constitutional:      General: She is not in acute distress.    Appearance: She is well-developed.  Cardiovascular:     Rate and Rhythm: Normal rate and regular rhythm.     Heart sounds: Normal heart sounds.  Pulmonary:     Effort: Pulmonary effort is normal.     Breath sounds: Normal breath sounds.  Skin:    General: Skin is warm and dry.  Neurological:     Mental Status: She is alert and oriented to person, place, and time.  Psychiatric:        Behavior: Behavior normal.        Thought Content: Thought content normal.        Judgment: Judgment normal.         Assessment & Plan:   Problem List Items Addressed This Visit      Other   Travel advice encounter - Primary    There are no contraindications to travel.   Issues discussed: environmental concerns, freshwater swimming, future shots, insect-borne illnesses, malaria, motion sickness, MVA safety, rabies, safe food/water, traveler's diarrhea, website/handouts for more information, what to do if ill upon return and what to  do if ill while there.  Malaria prophylaxis: malarone, daily dose starting 1-2 days before entering  endemic area, ending 7 days after leaving area Traveler's diarrhea prophylaxis: azithromycin.      Relevant Medications   atovaquone-proguanil (MALARONE) 250-100 MG TABS tablet   Other Relevant Orders   Typhoid VICPS vaccine im (Completed)   Hepatitis A vaccine adult IM (Completed)    Other Visit Diagnoses    Need for hepatitis A immunization       Relevant Orders   Hepatitis A vaccine adult IM (Completed)   Need for immunization against typhoid       Relevant Orders   Typhoid VICPS vaccine im (Completed)   Need for malaria prophylaxis       Relevant Medications   atovaquone-proguanil (MALARONE) 250-100 MG TABS tablet   At risk for infectious disease due to recent foreign travel       Relevant Medications   atovaquone-proguanil (MALARONE) 250-100 MG TABS tablet   Other Relevant Orders   Typhoid VICPS vaccine im (Completed)   Hepatitis A vaccine adult IM (Completed)   History of foreign travel       Relevant Medications   atovaquone-proguanil (MALARONE) 250-100 MG TABS tablet   Other Relevant Orders   Typhoid VICPS vaccine im (Completed)   Hepatitis A vaccine adult IM (Completed)   Vaccine counseling       Relevant Medications   atovaquone-proguanil (MALARONE) 250-100 MG TABS tablet   Other Relevant Orders   Typhoid VICPS vaccine im (Completed)   Hepatitis A vaccine adult IM (Completed)       I am having Michelle Wagner start on atovaquone-proguanil. I am also having her maintain her Vitamin D3, Calcium Carbonate-Vitamin D (CALCIUM + D PO), b complex vitamins, acetaminophen, ibuprofen, tamoxifen, Efinaconazole, and ciclopirox.   Meds ordered this encounter  Medications  . atovaquone-proguanil (MALARONE) 250-100 MG TABS tablet    Sig: Take 1 tablet by mouth daily starting 2 days prior to travel until 7 days after return.    Dispense:  28 tablet    Refill:  0    Order  Specific Question:   Supervising Provider    Answer:   Carlyle Basques [[id number]]     Follow-up: As needed.    Terri Piedra, MSN, FNP-C Nurse Practitioner [facility] Office phone: [phone] Pager: [place] number: [phone]

## 2019-01-08 NOTE — Progress Notes (Deleted)
Subjective:    Patient ID: Michelle Wagner, female    DOB: December 16, 1965, 53 y.o.   MRN: [id number]  Chief Complaint  Patient presents with  . Travel Consult    [place] 2/7-2/23    HPI:  Michelle Wagner is a 53 y.o. female who presents today    Planned departure: February 7 Planned return: February 23 (2 weeks) Countries:[place] in the country [place] Accomodations: Homes / [employer] of travel: Vacation/Business  Proir travel outside [place]: Yes  No Known Allergies    Outpatient Medications Prior to Visit  Medication Sig Dispense Refill  . acetaminophen (TYLENOL) 325 MG tablet Take 325 mg by mouth daily as needed (for headache.).    Marland Kitchen b complex vitamins capsule Take 1 capsule by mouth daily.    . Calcium Carbonate-Vitamin D (CALCIUM + D PO) Take 1 tablet by mouth daily.    . Cholecalciferol (VITAMIN D3) 1000 UNITS CAPS Take 1 capsule by mouth daily.    . ciclopirox (PENLAC) 8 % solution SEE NOTES 6.6 mL 0  . Efinaconazole 10 % SOLN apply TOPICALLY to affected toenails once daily for 48 weeks; completely cover the toenail, including folds, bed, hyponychium, and undersurface of toenail plate with each application 4 mL 1  . ibuprofen (ADVIL,MOTRIN) 200 MG tablet Take 200 mg by mouth every 6 (six) hours as needed for headache.    . tamoxifen (NOLVADEX) 20 MG tablet TAKE 1 TABLET BY MOUTH DAILY 90 tablet 0   No facility-administered medications prior to visit.      Past Medical History:  Diagnosis Date  . Breast cancer ([facility]) 10/12/2009   right lumpectomy herpos ert neg dcis multilocal invasionN0M0 rs herceptin radiation 2011  . DCIS (ductal carcinoma in situ) of breast 01/09/13   left   . GERD (gastroesophageal reflux disease)   . H/O gastroesophageal reflux (GERD)    endoscopy  . Headache(784.0)   . History of radiation therapy 04/06/10-05/18/10   right breast  . History of radiation therapy 03/07/13-04/05/13   left breast/ 52.72 total Gy/25fx  . Hx of migraines   . PONV  (postoperative nausea and vomiting)       Past Surgical History:  Procedure Laterality Date  . ABDOMINAL HYSTERECTOMY  2008  . BREAST LUMPECTOMY  2010   right  . BREAST LUMPECTOMY WITH NEEDLE LOCALIZATION Left 02/06/2013   Procedure: BREAST LUMPECTOMY WITH NEEDLE LOCALIZATION;  Surgeon: Stark Klein, MD;  Location: [facility];  Service: General;  Laterality: Left;  left breast needle localization lumpectomy      Family History  Problem Relation Age of Onset  . Uterine cancer Maternal Aunt 88  . Thalassemia Cousin        several paternal cousins with Beta Thal  . Eating disorder Daughter   . Hyperthyroidism Mother   . Graves' disease Mother   . Diabetes Father   . Hypertension Father   . Hypertension Brother   . Hyperlipidemia Brother        one brother w/ hihg bp/ chol  . Breast cancer Maternal Aunt        single mastectomy, no chemo; dx in her 59s  . Stomach cancer Cousin 29       Klinefelter syndrome  . Klinefelter's syndrome Cousin   . Diabetes Mellitus II Cousin       Social History   Socioeconomic History  . Marital status: Married    Spouse name: Not on file  . Number of children: Not on file  .  Years of education: Not on file  . Highest education level: Not on file  Occupational History  . Not on file  Social Needs  . Financial resource strain: Not on file  . Food insecurity:    Worry: Not on file    Inability: Not on file  . Transportation needs:    Medical: Not on file    Non-medical: Not on file  Tobacco Use  . Smoking status: Never Smoker  . Smokeless tobacco: Never Used  Substance and Sexual Activity  . Alcohol use: No  . Drug use: No  . Sexual activity: Yes  Lifestyle  . Physical activity:    Days per week: Not on file    Minutes per session: Not on file  . Stress: Not on file  Relationships  . Social connections:    Talks on phone: Not on file    Gets together: Not on file    Attends religious service: Not on file    Active member of  club or organization: Not on file    Attends meetings of clubs or organizations: Not on file    Relationship status: Not on file  . Intimate partner violence:    Fear of current or ex partner: Not on file    Emotionally abused: Not on file    Physically abused: Not on file    Forced sexual activity: Not on file  Other Topics Concern  . Not on file  Social History Narrative   hh of  7  Husband children and MInLAW   Pet 1 cats.    Neg tobacco   Masters level education   G4P4   To work vol at hospital    Lots of driving her children around                      Review of Systems     Objective:    There were no vitals taken for this visit. Nursing note and vital signs reviewed.  Physical Exam      Assessment & Plan:   Problem List Items Addressed This Visit    None    Visit Diagnoses    Travel advice encounter    -  Primary   Relevant Medications   atovaquone-proguanil (MALARONE) 250-100 MG TABS tablet   Other Relevant Orders   Typhoid VICPS vaccine im (Completed)   Hepatitis A vaccine adult IM (Completed)   Need for hepatitis A immunization       Relevant Orders   Hepatitis A vaccine adult IM (Completed)   Need for immunization against typhoid       Relevant Orders   Typhoid VICPS vaccine im (Completed)   Need for malaria prophylaxis       Relevant Medications   atovaquone-proguanil (MALARONE) 250-100 MG TABS tablet   At risk for infectious disease due to recent foreign travel       Relevant Medications   atovaquone-proguanil (MALARONE) 250-100 MG TABS tablet   Other Relevant Orders   Typhoid VICPS vaccine im (Completed)   Hepatitis A vaccine adult IM (Completed)   History of foreign travel       Relevant Medications   atovaquone-proguanil (MALARONE) 250-100 MG TABS tablet   Other Relevant Orders   Typhoid VICPS vaccine im (Completed)   Hepatitis A vaccine adult IM (Completed)   Vaccine counseling       Relevant Medications   atovaquone-proguanil  (MALARONE) 250-100 MG TABS tablet   Other Relevant Orders  Typhoid VICPS vaccine im (Completed)   Hepatitis A vaccine adult IM (Completed)       I am having Jakala Groot start on atovaquone-proguanil. I am also having her maintain her Vitamin D3, Calcium Carbonate-Vitamin D (CALCIUM + D PO), b complex vitamins, acetaminophen, ibuprofen, tamoxifen, Efinaconazole, and ciclopirox.   Meds ordered this encounter  Medications  . atovaquone-proguanil (MALARONE) 250-100 MG TABS tablet    Sig: Take 1 tablet by mouth daily starting 2 days prior to travel until 7 days after return.    Dispense:  28 tablet    Refill:  0    Order Specific Question:   Supervising Provider    Answer:   Carlyle Basques [[id number]]     Follow-up: No follow-ups on file.    Terri Piedra, MSN, FNP-C Nurse Practitioner [facility] Office phone: [phone] Pager: [place] number: [phone]

## 2019-01-08 NOTE — Assessment & Plan Note (Signed)
There are no contraindications to travel.   Issues discussed: environmental concerns, freshwater swimming, future shots, insect-borne illnesses, malaria, motion sickness, MVA safety, rabies, safe food/water, traveler's diarrhea, website/handouts for more information, what to do if ill upon return and what to do if ill while there.  Malaria prophylaxis: malarone, daily dose starting 1-2 days before entering endemic area, ending 7 days after leaving area Traveler's diarrhea prophylaxis: azithromycin.

## 2019-01-08 NOTE — Patient Instructions (Signed)
[facility]                [address], [place]                   [place], [place] [address]                      Phone: [phone]                        Fax: [phone]   Planned departure date: February 7        Planned return date: February 23 Countries of travel: [place]   Guidelines for the Prevention & Treatment of Traveler's Diarrhea  Prevention: "Boil it, Peel it, Kennan it, or Forget it"   the fewer chances -> lower risk: try to stick to food & water precautions as much as possible"   If it's "piping hot"; it is probably okay, if not, it may not be   Treatment   1) You should always take care to drink lots of fluids in order to avoid dehydration   2) You should bring medications with you in case you come down with a case of diarrhea   3) OTC = bring pepto-bismol - can take with initial abdominal symptoms;                    Imodium - can help slow down your intestinal tract, can help relief cramps                    and diarrhea, can take if no bloody diarrhea  Use azithromycin if needed for traveler's diarrhea  Guidelines for the Prevention of Malaria  Avoidance:  -fewer mosquito bites = lower risk. Mosquitos can bite at night as well as daytime  -cover up (long sleeve clothing), mosquito nets, screens  -Insect repellent for your skin ( DEET containing lotion > 20%): for clothes ( permethrin spray) www.insectshield.com 937-251-0205) for pretreated permethrin  On February 5th start malarone, daily dose starting 1-2 days before entering endemic area, ending 7 days after leaving area for malaria prevention.   Immunizations received today: Hepatitis A series and Typhoid (parenteral)  Future immunizations, if indicated Hepatitis A series   Prior to travel:  1) Be sure to pick up appropriate prescriptions, including medicine you take daily. Do not expect to be able to fill your prescriptions abroad.  2) Strongly  consider obtaining traveler's insurance, including emergency evacuation insurance. Most plans in the [place] do not cover participants abroad. (see below for resources)  3) Register at the appropriate [place] embassy or consulate with travel dates so they are aware of your presence in-country and for helpful advice during travel using the [employer] (STEP, GreenNylon.com.cy).  4) Leave contact information with a relative or friend.  5) Keep a Research officer, political party, credit cards in case they become lost or stolen  6) Inform your credit card company that you will be travelling abroad   During travel:  1) If you become ill and need medical advice, the [place] [employer]	[place] of the country you are traveling in general provides a list of [place] speaking doctors.  We are also available on MyChart for remote consultation if you register prior to travel. 2) Avoid motorcycles or scooters when at all possible. Traffic laws in many countries are lax and accidents occur frequently.  3) Do not take any unnecessary risks that you wouldn't do at home.   Resources:  -Country specific information: BlindResource.ca or GreenNylon.com.cy  -Press photographer (DEET, mosquito nets): [employer], [employer] store, [employer], [place] insurance options: gatewayplans.com; http://clayton-rivera.info/; travelguard.com or [employer], gninsurance.com or info@gninsurance .com, [id number].   Post Travel:  If you return from your trip ill, call your primary care doctor or our travel clinic @ [phone].   Enjoy your trip and know that with proper pre-travel preparation, most people have an enjoyable and uninterrupted trip!

## 2019-01-15 MED FILL — TAMOXIFEN CITRATE 20 MG TAB: 20 | 90 days supply | Qty: 90 | Fill #0

## 2019-01-17 MED FILL — OSELTAMIVIR PHOS 75 MG CAP: 75 | 5 days supply | Qty: 10 | Fill #0

## 2019-01-17 MED FILL — ONDANSETRON HCL 4 MG TABLET: 4 | 5 days supply | Qty: 30 | Fill #0

## 2019-06-03 MED FILL — TAMOXIFEN CITRATE 20 MG TAB: 20 | 90 days supply | Qty: 90 | Fill #1

## 2019-09-18 NOTE — Progress Notes (Signed)
Chief Complaint  Patient presents with  . Annual Exam    wants to discuss toe fungus     HPI: Patient  Michelle Wagner  53 y.o. comes in today for Preventive Health Care visit  Breast cancer  Out 2 years   On Tamoxifen.  Right great toenail used pen lac had initial help but never went away and came back  Using other otc trials  Would like to try jublia  rx again not orals cause of med IA  Getting mammo soon  followed gyne and  Oncology  Has gained weight with studying this summer  Hx low vit d  On supplement   Health Maintenance  Topic Date Due  . HIV Screening  08/14/1981  . MAMMOGRAM  05/11/2017  . TETANUS/TDAP  09/28/2020  . COLONOSCOPY  05/07/2025  . INFLUENZA VACCINE  Completed   Health Maintenance Review LIFESTYLE:  Exercise:   Walking  Just.  picked up some    End residency .   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   7 d 8 hours  on and off,.   ROS:   consstipation   With less water.  miralax and sometimes   Smooth tea.  GEN/ HEENT: No fever, significant weight changes sweats headaches vision problems hearing changes, CV/ PULM; No chest pain shortness of breath cough, syncope,edema  change in exercise tolerance. GI /GU: No adominal pain, vomiting, change in bowel habits. No blood in the stool. No significant GU symptoms. SKIN/HEME: ,no acute skin rashes suspicious lesions or bleeding. No lymphadenopathy, nodules, masses.  NEURO/ PSYCH:  No neurologic signs such as weakness numbness. No depression anxiety. IMM/ Allergy: No unusual infections.  Allergy .   REST of 12 system review negative except as per HPI   Past Medical History:  Diagnosis Date  . Breast cancer ([facility]) 10/12/2009   right lumpectomy herpos ert neg dcis multilocal invasionN0M0 rs herceptin radiation 2011  . DCIS (ductal carcinoma in situ)  of breast 01/09/13   left   . GERD (gastroesophageal reflux disease)   . H/O gastroesophageal reflux (GERD)    endoscopy  . Headache(784.0)   . History of radiation therapy 04/06/10-05/18/10   right breast  . History of radiation therapy 03/07/13-04/05/13   left breast/ 52.72 total Gy/69fx  . Hx of migraines   . PONV (postoperative nausea and vomiting)     Past Surgical History:  Procedure Laterality Date  . ABDOMINAL HYSTERECTOMY  2008  . BREAST LUMPECTOMY  2010   right  . BREAST LUMPECTOMY WITH NEEDLE LOCALIZATION Left 02/06/2013   Procedure: BREAST LUMPECTOMY WITH NEEDLE LOCALIZATION;  Surgeon: Stark Klein, MD;  Location: [facility];  Service: General;  Laterality: Left;  left breast needle localization lumpectomy    Family History  Problem Relation Age of Onset  . Uterine cancer Maternal Aunt 88  . Thalassemia Cousin        several paternal cousins with Beta Thal  . Eating disorder Daughter   . Hyperthyroidism Mother   . Graves' disease Mother   . Diabetes Father   . Hypertension Father   . Hypertension Brother   . Hyperlipidemia Brother        one brother w/ hihg bp/ chol  . Breast  cancer Maternal Aunt        single mastectomy, no chemo; dx in her 21s  . Stomach cancer Cousin 29       Klinefelter syndrome  . Klinefelter's syndrome Cousin   . Diabetes Mellitus II Cousin     Social History   Socioeconomic History  . Marital status: Married    Spouse name: Not on file  . Number of children: Not on file  . Years of education: Not on file  . Highest education level: Not on file  Occupational History  . Not on file  Social Needs  . Financial resource strain: Not on file  . Food insecurity    Worry: Not on file    Inability: Not on file  . Transportation needs    Medical: Not on file    Non-medical: Not on file  Tobacco Use  . Smoking status: Never Smoker  . Smokeless tobacco: Never Used  Substance and Sexual Activity  . Alcohol use: No  . Drug use: No  . Sexual  activity: Yes  Lifestyle  . Physical activity    Days per week: Not on file    Minutes per session: Not on file  . Stress: Not on file  Relationships  . Social Herbalist on phone: Not on file    Gets together: Not on file    Attends religious service: Not on file    Active member of club or organization: Not on file    Attends meetings of clubs or organizations: Not on file    Relationship status: Not on file  Other Topics Concern  . Not on file  Social History Narrative   hh of  7  Husband children and MInLAW   Pet 1 cats.    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 around                 Outpatient Medications Prior to Visit  Medication Sig Dispense Refill  . acetaminophen (TYLENOL) 325 MG tablet Take 325 mg by mouth daily as needed (for headache.).    Marland Kitchen Calcium Carbonate-Vitamin D (CALCIUM + D PO) Take 1 tablet by mouth daily.    . Cholecalciferol (VITAMIN D3) 1000 UNITS CAPS Take 1 capsule by mouth daily.    Marland Kitchen ibuprofen (ADVIL,MOTRIN) 200 MG tablet Take 200 mg by mouth every 6 (six) hours as needed for headache.    . tamoxifen (NOLVADEX) 20 MG tablet TAKE 1 TABLET BY MOUTH DAILY 90 tablet 0  . b complex vitamins capsule Take 1 capsule by mouth daily.    Marland Kitchen atovaquone-proguanil (MALARONE) 250-100 MG TABS tablet Take 1 tablet by mouth daily starting 2 days prior to travel until 7 days after return. (Patient not taking: Reported on 09/20/2019) 28 tablet 0  . ciclopirox (PENLAC) 8 % solution SEE NOTES (Patient not taking: Reported on 09/20/2019) 6.6 mL 0  . Efinaconazole 10 % SOLN apply TOPICALLY to affected toenails once daily for 48 weeks; completely cover the toenail, including folds, bed, hyponychium, and undersurface of toenail plate with each application (Patient not taking: Reported on 09/20/2019) 4 mL 1   No facility-administered medications prior to visit.       EXAM:  BP 112/64 (BP Location: Right Arm, Patient Position: Sitting, Cuff Size: Normal)   Pulse 94   Temp 98.1  F (36.7 C) (Temporal)   Ht 5\' 1"  (1.549 m)   Wt 135 lb 9.6 oz (61.5 kg)   SpO2 97%   BMI 25.62 kg/m   Body mass index is 25.62 kg/m. Wt Readings from Last 3 Encounters:  09/20/19 135 lb 9.6 oz (61.5 kg)  02/11/16 125 lb 1.6 oz (56.7 kg)  02/20/15 125 lb 8 oz (56.9 kg)    Physical Exam: Vital signs reviewed [id number] is a well-developed well-nourished alert cooperative    who appearsr stated age in no acute distress.  HEENT: normocephalic atraumatic , Eyes: PERRL EOM's full, conjunctiva clear,., Ears: no deformity EAC's clear TMs with normal landmarks. Mouth: masked NECK: supple without masses, thyromegaly or bruits. CHEST/PULM:  Clear to auscultation and percussion breath sounds equal no wheeze , rales or rhonchi. No chest wall deformities or tenderness. Breast: normal by inspection .well healed scar right  Axilla clear no nodules felt  CV: PMI is nondisplaced, S1 S2 no gallops, murmurs, rubs. Peripheral pulses are full without delay.No JVD .  ABDOMEN: Bowel sounds normal nontender  No guard or rebound, no hepato splenomegal no CVA tenderness.  No hernia. Extremtities:  No clubbing cyanosis or edema, no acute joint swelling or redness no focal atrophy NEURO:  Oriented x3, cranial nerves 3-12 appear to be intact, no obvious focal weakness,gait within normal limits no abnormal reflexes or asymmetrical SKIN: No acute rashes normal turgor, color, no bruising or petechiae. right great toenail lateral border with drey dark 2 mm above nail bed  PSYCH: Oriented, good eye contact, no obvious depression anxiety, cognition and judgment appear normal. LN: no cervical axillary inguinal adenopathy  Lab Results  Component Value Date   WBC 5.8 02/11/2016   HGB 12.8 02/11/2016   HCT 37.0 02/11/2016   PLT 193.0 02/11/2016   GLUCOSE 78 02/11/2016   CHOL 143 03/26/2014   TRIG 119.0  03/26/2014   HDL 53.80 03/26/2014   LDLCALC 65 03/26/2014   ALT 12 02/11/2016   AST 14 02/11/2016   NA 140 02/11/2016   K 3.6 02/11/2016   CL 106 02/11/2016   CREATININE 0.71 02/11/2016   BUN 11 02/11/2016   CO2 28 02/11/2016   TSH 1.50 02/11/2016   INR 0.87 03/19/2010    BP Readings from Last 3 Encounters:  09/20/19 112/64  02/11/16 100/66  02/20/15 96/66    Lab plan  reviewed with patient   ASSESSMENT AND PLAN:  Discussed the following assessment and plan:    ICD-10-CM   1. Visit for preventive health examination  123456 Basic metabolic panel    CBC with Differential/Platelet    Hemoglobin A1c    Hepatic function panel    Lipid panel    TSH    VITAMIN D 25 Hydroxy (Vit-D Deficiency, Fractures)  2. Medication management  123456 Basic metabolic panel    CBC with Differential/Platelet    Hemoglobin A1c    Hepatic function panel    Lipid panel    TSH    VITAMIN D 25 Hydroxy (Vit-D Deficiency, Fractures)  3. Use of tamoxifen (Nolvadex)  0000000 Basic metabolic panel    CBC with Differential/Platelet    Hemoglobin A1c    Hepatic function panel    Lipid panel    TSH    VITAMIN D 25 Hydroxy (Vit-D Deficiency, Fractures)  4. Onychomycosis  B35.1   5. Vitamin D deficiency  [id number] Basic metabolic panel    CBC with Differential/Platelet    Hemoglobin A1c    Hepatic function  panel    Lipid panel    TSH    VITAMIN D 25 Hydroxy (Vit-D Deficiency, Fractures)  6. Malignant neoplasm of female breast, unspecified estrogen receptor status, unspecified laterality, unspecified site of breast ([place])  99991111 Basic metabolic panel    CBC with Differential/Platelet    Hemoglobin A1c    Hepatic function panel    Lipid panel    TSH    VITAMIN D 25 Hydroxy (Vit-D Deficiency, Fractures)  lab  topicla sent to phamracy may need pa or not covered   Patient Care Team: Biddie Sebek, Standley Brooking, MD as PCP - General Servando Salina, MD as Attending Physician (Obstetrics and Gynecology)  Marcy Panning, MD (Hematology and Oncology) Patient Instructions  Make fasting lab appt  Will notify you  of labs when available.  Glad you are doing well and good luck with your new work.     Onychomycosis/Fungal Toenails  WHAT IS IT? An infection that lies within the keratin of your nail plate that is caused by a fungus.  WHY ME? Fungal infections affect all ages, sexes, races, and creeds.  There may be many factors that predispose you to a fungal infection such as age, coexisting medical conditions such as diabetes, or an autoimmune disease; stress, medications, fatigue, genetics, etc.  Bottom line: fungus thrives in a warm, moist environment and your shoes offer such a location.  IS IT CONTAGIOUS? Theoretically, yes.  You do not want to share shoes, nail clippers or files with someone who has fungal toenails.  Walking around barefoot in the same room or sleeping in the same bed is unlikely to transfer the organism.  It is important to realize, however, that fungus can spread easily from one nail to the next on the same foot.  HOW DO WE TREAT THIS?  There are several ways to treat this condition.  Treatment may depend on many factors such as age, medications, pregnancy, liver and kidney conditions, etc.  It is best to ask your doctor which options are available to you.  1. No treatment.   Unlike many other medical concerns, you can live with this condition.  However for many people this can be a painful condition and may lead to ingrown toenails or a bacterial infection.  It is recommended that you keep the nails cut short to help reduce the amount of fungal nail. 2. Topical treatment.  These range from herbal remedies to prescription strength nail lacquers.  About 40-50% effective, topicals require twice daily application for approximately 9 to 12 months or until an entirely new nail has grown out.  The most effective topicals are medical grade medications available through physicians  offices. 3. Oral antifungal medications.  With an 80-90% cure rate, the most common oral medication requires 3 to 4 months of therapy and stays in your system for a year as the new nail grows out.  Oral antifungal medications do require blood work to make sure it is a safe drug for you.  A liver function panel will be performed prior to starting the medication and after the first month of treatment.  It is important to have the blood work performed to avoid any harmful side effects.  In general, this medication safe but blood work is required. 4. Laser Therapy.  This treatment is performed by applying a specialized laser to the affected nail plate.  This therapy is noninvasive, fast, and non-painful.  It is not covered by insurance and is therefore, out of pocket.  The  results have been very good with a 80-95% cure rate.  The [facility] is the only practice in the area to offer this therapy. 5. Permanent Nail Avulsion.  Removing the entire nail so that a new nail will not grow back.   Standley Brooking. Rosaura Bolon M.D.

## 2019-09-19 ENCOUNTER — Other Ambulatory Visit: Payer: Self-pay | Admitting: Internal Medicine

## 2019-09-19 DIAGNOSIS — N6489 Other specified disorders of breast: Secondary | ICD-10-CM

## 2019-09-20 ENCOUNTER — Encounter: Payer: Self-pay | Admitting: Internal Medicine

## 2019-09-20 ENCOUNTER — Other Ambulatory Visit: Payer: Self-pay

## 2019-09-20 ENCOUNTER — Ambulatory Visit (INDEPENDENT_AMBULATORY_CARE_PROVIDER_SITE_OTHER): Payer: BC Managed Care – PPO | Admitting: Internal Medicine

## 2019-09-20 VITALS — BP 112/64 | HR 94 | Temp 98.1°F | Ht 61.0 in | Wt 135.6 lb

## 2019-09-20 DIAGNOSIS — Z0000 Encounter for general adult medical examination without abnormal findings: Secondary | ICD-10-CM | POA: Diagnosis not present

## 2019-09-20 DIAGNOSIS — E559 Vitamin D deficiency, unspecified: Secondary | ICD-10-CM

## 2019-09-20 DIAGNOSIS — Z79810 Long term (current) use of selective estrogen receptor modulators (SERMs): Secondary | ICD-10-CM

## 2019-09-20 DIAGNOSIS — C50919 Malignant neoplasm of unspecified site of unspecified female breast: Secondary | ICD-10-CM

## 2019-09-20 DIAGNOSIS — Z79899 Other long term (current) drug therapy: Secondary | ICD-10-CM | POA: Diagnosis not present

## 2019-09-20 DIAGNOSIS — B351 Tinea unguium: Secondary | ICD-10-CM

## 2019-09-20 MED ORDER — EFINACONAZOLE 10 % EX SOLN: CUTANEOUS | 1 refills | Status: DC

## 2019-09-20 NOTE — Patient Instructions (Addendum)
Make fasting lab appt  Will notify you  of labs when available.  Glad you are doing well and good luck with your new work.     Onychomycosis/Fungal Toenails  WHAT IS IT? An infection that lies within the keratin of your nail plate that is caused by a fungus.  WHY ME? Fungal infections affect all ages, sexes, races, and creeds.  There may be many factors that predispose you to a fungal infection such as age, coexisting medical conditions such as diabetes, or an autoimmune disease; stress, medications, fatigue, genetics, etc.  Bottom line: fungus thrives in a warm, moist environment and your shoes offer such a location.  IS IT CONTAGIOUS? Theoretically, yes.  You do not want to share shoes, nail clippers or files with someone who has fungal toenails.  Walking around barefoot in the same room or sleeping in the same bed is unlikely to transfer the organism.  It is important to realize, however, that fungus can spread easily from one nail to the next on the same foot.  HOW DO WE TREAT THIS?  There are several ways to treat this condition.  Treatment may depend on many factors such as age, medications, pregnancy, liver and kidney conditions, etc.  It is best to ask your doctor which options are available to you.  1. No treatment.   Unlike many other medical concerns, you can live with this condition.  However for many people this can be a painful condition and may lead to ingrown toenails or a bacterial infection.  It is recommended that you keep the nails cut short to help reduce the amount of fungal nail. 2. Topical treatment.  These range from herbal remedies to prescription strength nail lacquers.  About 40-50% effective, topicals require twice daily application for approximately 9 to 12 months or until an entirely new nail has grown out.  The most effective topicals are medical grade medications available through physicians offices. 3. Oral antifungal medications.  With an 80-90% cure rate, the  most common oral medication requires 3 to 4 months of therapy and stays in your system for a year as the new nail grows out.  Oral antifungal medications do require blood work to make sure it is a safe drug for you.  A liver function panel will be performed prior to starting the medication and after the first month of treatment.  It is important to have the blood work performed to avoid any harmful side effects.  In general, this medication safe but blood work is required. 4. Laser Therapy.  This treatment is performed by applying a specialized laser to the affected nail plate.  This therapy is noninvasive, fast, and non-painful.  It is not covered by insurance and is therefore, out of pocket.  The results have been very good with a 80-95% cure rate.  [facility] is the only practice in the area to offer this therapy. 5. Permanent Nail Avulsion.  Removing the entire nail so that a new nail will not grow back.

## 2019-09-23 ENCOUNTER — Other Ambulatory Visit (INDEPENDENT_AMBULATORY_CARE_PROVIDER_SITE_OTHER): Payer: BC Managed Care – PPO

## 2019-09-23 ENCOUNTER — Other Ambulatory Visit: Payer: Self-pay

## 2019-09-23 DIAGNOSIS — Z79899 Other long term (current) drug therapy: Secondary | ICD-10-CM | POA: Diagnosis not present

## 2019-09-23 DIAGNOSIS — C50919 Malignant neoplasm of unspecified site of unspecified female breast: Secondary | ICD-10-CM

## 2019-09-23 DIAGNOSIS — Z0000 Encounter for general adult medical examination without abnormal findings: Secondary | ICD-10-CM

## 2019-09-23 DIAGNOSIS — E559 Vitamin D deficiency, unspecified: Secondary | ICD-10-CM | POA: Diagnosis not present

## 2019-09-23 DIAGNOSIS — Z79810 Long term (current) use of selective estrogen receptor modulators (SERMs): Secondary | ICD-10-CM | POA: Diagnosis not present

## 2019-09-23 LAB — LIPID PANEL
Cholesterol: 152 mg/dL (ref 0–200)
HDL: 54.7 mg/dL (ref >39.00)
LDL Cholesterol: 70 mg/dL (ref 0–99)
NonHDL: 97.63
Total CHOL/HDL Ratio: 3
Triglycerides: 137 mg/dL (ref 0.0–149.0)
VLDL: 27.4 mg/dL (ref 0.0–40.0)

## 2019-09-23 LAB — BASIC METABOLIC PANEL
BUN: 13 mg/dL (ref 6–23)
CO2: 28 mEq/L (ref 19–32)
Calcium: 8.8 mg/dL (ref 8.4–10.5)
Chloride: 105 mEq/L (ref 96–112)
Creatinine, Ser: 0.73 mg/dL (ref 0.40–1.20)
GFR: 83.36 mL/min (ref >60.00)
Glucose, Bld: 87 mg/dL (ref 70–99)
Potassium: 4 mEq/L (ref 3.5–5.1)
Sodium: 141 mEq/L (ref 135–145)

## 2019-09-23 LAB — CBC WITH DIFFERENTIAL/PLATELET
Basophils Absolute: 0.1 10*3/uL (ref 0.0–0.1)
Basophils Relative: 0.9 % (ref 0.0–3.0)
Eosinophils Absolute: 0.2 10*3/uL (ref 0.0–0.7)
Eosinophils Relative: 3.5 % (ref 0.0–5.0)
HCT: 38.6 % (ref 36.0–46.0)
Hemoglobin: 13.2 g/dL (ref 12.0–15.0)
Lymphocytes Relative: 39.3 % (ref 12.0–46.0)
Lymphs Abs: 2.5 10*3/uL (ref 0.7–4.0)
MCHC: 34.3 g/dL (ref 30.0–36.0)
MCV: 87.7 fl (ref 78.0–100.0)
Monocytes Absolute: 0.3 10*3/uL (ref 0.1–1.0)
Monocytes Relative: 4.7 % (ref 3.0–12.0)
Neutro Abs: 3.3 10*3/uL (ref 1.4–7.7)
Neutrophils Relative %: 51.6 % (ref 43.0–77.0)
Platelets: 206 10*3/uL (ref 150.0–400.0)
RBC: 4.4 Mil/uL (ref 3.87–5.11)
RDW: 12.1 % (ref 11.5–15.5)
WBC: 6.3 10*3/uL (ref 4.0–10.5)

## 2019-09-23 LAB — TSH: TSH: 2.15 u[IU]/mL (ref 0.35–4.50)

## 2019-09-23 LAB — VITAMIN D 25 HYDROXY (VIT D DEFICIENCY, FRACTURES): VITD: 32.15 ng/mL (ref 30.00–100.00)

## 2019-09-23 LAB — HEPATIC FUNCTION PANEL
ALT: 16 U/L (ref 0–35)
AST: 17 U/L (ref 0–37)
Albumin: 3.8 g/dL (ref 3.5–5.2)
Alkaline Phosphatase: 58 U/L (ref 39–117)
Bilirubin, Direct: 0.1 mg/dL (ref 0.0–0.3)
Total Bilirubin: 0.3 mg/dL (ref 0.2–1.2)
Total Protein: 6.5 g/dL (ref 6.0–8.3)

## 2019-09-23 LAB — HEMOGLOBIN A1C: Hgb A1c MFr Bld: 5.4 % (ref 4.6–6.5)

## 2019-10-01 MED FILL — TAMOXIFEN 20 MG TABLET: 20 | 90 days supply | Qty: 90 | Fill #2

## 2020-03-11 ENCOUNTER — Ambulatory Visit
Admission: RE | Admit: 2020-03-11 | Discharge: 2020-03-11 | Disposition: A | Discharge status: Home / Self Care | Payer: Self-pay | Source: Ambulatory Visit | Attending: Internal Medicine | Admitting: Internal Medicine

## 2020-03-11 ENCOUNTER — Other Ambulatory Visit: Payer: Self-pay

## 2020-03-11 DIAGNOSIS — N6489 Other specified disorders of breast: Secondary | ICD-10-CM

## 2020-03-11 DIAGNOSIS — R922 Inconclusive mammogram: Secondary | ICD-10-CM | POA: Diagnosis not present

## 2020-03-11 DIAGNOSIS — Z853 Personal history of malignant neoplasm of breast: Secondary | ICD-10-CM | POA: Diagnosis not present

## 2020-03-11 HISTORY — DX: Personal history of irradiation: Z92.3

## 2020-04-15 ENCOUNTER — Other Ambulatory Visit (HOSPITAL_COMMUNITY): Payer: Self-pay | Admitting: Oncology

## 2020-04-15 MED FILL — TAMOXIFEN 20 MG TABLET: 20 | 90 days supply | Qty: 90 | Fill #0

## 2020-05-06 MED FILL — GABAPENTIN 300 MG CAPSULE: 300 | 30 days supply | Qty: 60 | Fill #0

## 2020-08-13 ENCOUNTER — Other Ambulatory Visit: Payer: Self-pay

## 2020-08-13 ENCOUNTER — Ambulatory Visit: Payer: 59 | Admitting: Podiatry

## 2020-08-13 ENCOUNTER — Ambulatory Visit (INDEPENDENT_AMBULATORY_CARE_PROVIDER_SITE_OTHER): Payer: 59

## 2020-08-13 DIAGNOSIS — M79671 Pain in right foot: Secondary | ICD-10-CM

## 2020-08-13 DIAGNOSIS — B351 Tinea unguium: Secondary | ICD-10-CM | POA: Diagnosis not present

## 2020-08-13 DIAGNOSIS — M21619 Bunion of unspecified foot: Secondary | ICD-10-CM | POA: Diagnosis not present

## 2020-08-13 DIAGNOSIS — M79672 Pain in left foot: Secondary | ICD-10-CM

## 2020-08-13 MED ORDER — EFINACONAZOLE 10 % EX SOLN: 1.0000 [drp] | Freq: Every day | CUTANEOUS | 11 refills | Status: DC

## 2020-08-13 NOTE — Patient Instructions (Addendum)
Efinaconazole Topical Solution What is this medicine? EFINACONAZOLE (e FEE na KON a zole) is an antifungal medicine. It is used to treat certain kinds of fungal infections of the toenail. This medicine may be used for other purposes; ask your health care provider or pharmacist if you have questions. COMMON BRAND NAME(S): JUBLIA What should I tell my health care provider before I take this medicine? They need to know if you have any of these conditions:  an unusual or allergic reaction to efinaconazole, other medicines, foods, dyes or preservatives  pregnant or trying to get pregnant  breast-feeding How should I use this medicine? This medicine is for external use only. Do not take by mouth. Follow the directions on the label. Wash hands before and after use. Apply this medicine using the provided brush to cover the entire toenail. Do not use your medicine more often than directed. Finish the full course prescribed by your doctor or health care professional even if you think your condition is better. Do not stop using except on the advice of your doctor or health care professional. Talk to your pediatrician regarding the use of this medicine in children. While this drug may be prescribed for children as young as 6 years for selected conditions, precautions do apply. Overdosage: If you think you have taken too much of this medicine contact a poison control center or emergency room at once. NOTE: This medicine is only for you. Do not share this medicine with others. What if I miss a dose? If you miss a dose, use it as soon as you can. If it is almost time for your next dose, use only that dose. Do not use double or extra doses. What may interact with this medicine? Interactions have not been studied. Do not use any other nail products (i.e., nail polish, pedicures) during treatment with this medicine. This list may not describe all possible interactions. Give your health care provider a list of  all the medicines, herbs, non-prescription drugs, or dietary supplements you use. Also tell them if you smoke, drink alcohol, or use illegal drugs. Some items may interact with your medicine. What should I watch for while using this medicine? Do not get this medicine in your eyes. If you do, rinse out with plenty of cool tap water. Tell your doctor or health care professional if your symptoms do not start to get better or if they get worse. Wait for at least 10 minutes after bathing before applying this medication. After bathing, make sure that your feet are very dry. Fungal infections like moist conditions. Do not walk around barefoot. To help prevent reinfection, wear freshly washed cotton, not synthetic clothing. Tell your doctor or health care professional if you develop sores or blisters that do not heal properly. If your nail infection returns after you stop using this medicine, contact your doctor or health care professional. What side effects may I notice from receiving this medicine? Side effects that you should report to your doctor or health care professional as soon as possible:  allergic reactions like skin rash, itching or hives, swelling of the face, lips, or tongue  ingrown toenail Side effects that usually do not require medical attention (report to your doctor or health care professional if they continue or are bothersome):  mild skin irritation, burning, or itching This list may not describe all possible side effects. Call your doctor for medical advice about side effects. You may report side effects to FDA at 1-800-FDA-1088. Where should I  keep my medicine? Keep out of the reach of children. Store at room temperature between 20 and 25 degrees C (68 and 77 degrees F). Keep this medicine in the original container. Throw away any unused medicine after the expiration date. This medicine is flammable. Avoid exposure to heat, fire, flame, and smoking. NOTE: This sheet is a summary.  It may not cover all possible information. If you have questions about this medicine, talk to your doctor, pharmacist, or health care provider.  2020 Elsevier/Gold Standard (2019-04-08 16:14:11)   Bunion  A bunion is a bump on the base of the big toe that forms when the bones of the big toe joint move out of position. Bunions may be small at first, but they often get larger over time. They can make walking painful. What are the causes? A bunion may be caused by:  Wearing narrow or pointed shoes that force the big toe to press against the other toes.  Abnormal foot development that causes the foot to roll inward (pronate).  Changes in the foot that are caused by certain diseases, such as rheumatoid arthritis or polio.  A foot injury. What increases the risk? The following factors may make you more likely to develop this condition:  Wearing shoes that squeeze the toes together.  Having certain diseases, such as: ? Rheumatoid arthritis. ? Polio. ? Cerebral palsy.  Having family members who have bunions.  Being born with a foot deformity, such as flat feet or low arches.  Doing activities that put a lot of pressure on the feet, such as ballet dancing. What are the signs or symptoms? The main symptom of a bunion is a noticeable bump on the big toe. Other symptoms may include:  Pain.  Swelling around the big toe.  Redness and inflammation.  Thick or hardened skin on the big toe or between the toes.  Stiffness or loss of motion in the big toe.  Trouble with walking. How is this diagnosed? A bunion may be diagnosed based on your symptoms, medical history, and activities. You may have tests, such as:  X-rays. These allow your health care provider to check the position of the bones in your foot and look for damage to your joint. They also help your health care provider determine the severity of your bunion and the best way to treat it.  Joint aspiration. In this test, a  sample of fluid is removed from the toe joint. This test may be done if you are in a lot of pain. It helps rule out diseases that cause painful swelling of the joints, such as arthritis. How is this treated? Treatment depends on the severity of your symptoms. The goal of treatment is to relieve symptoms and prevent the bunion from getting worse. Your health care provider may recommend:  Wearing shoes that have a wide toe box.  Using bunion pads to cushion the affected area.  Taping your toes together to keep them in a normal position.  Placing a device inside your shoe (orthotics) to help reduce pressure on your toe joint.  Taking medicine to ease pain, inflammation, and swelling.  Applying heat or ice to the affected area.  Doing stretching exercises.  Surgery to remove scar tissue and move the toes back into their normal position. This treatment is rare. Follow these instructions at home: Managing pain, stiffness, and swelling   If directed, put ice on the painful area: ? Put ice in a plastic bag. ? Place a towel between  your skin and the bag. ? Leave the ice on for 20 minutes, 2-3 times a day. Activity   If directed, apply heat to the affected area before you exercise. Use the heat source that your health care provider recommends, such as a moist heat pack or a heating pad. ? Place a towel between your skin and the heat source. ? Leave the heat on for 20-30 minutes. ? Remove the heat if your skin turns bright red. This is especially important if you are unable to feel pain, heat, or cold. You may have a greater risk of getting burned.  Do exercises as told by your health care provider. General instructions  Support your toe joint with proper footwear, shoe padding, or taping as told by your health care provider.  Take over-the-counter and prescription medicines only as told by your health care provider.  Keep all follow-up visits as told by your health care provider. This  is important. Contact a health care provider if your symptoms:  Get worse.  Do not improve in 2 weeks. Get help right away if you have:  Severe pain and trouble with walking. Summary  A bunion is a bump on the base of the big toe that forms when the bones of the big toe joint move out of position.  Bunions can make walking painful.  Treatment depends on the severity of your symptoms.  Support your toe joint with proper footwear, shoe padding, or taping as told by your health care provider. This information is not intended to replace advice given to you by your health care provider. Make sure you discuss any questions you have with your health care provider. Document Revised: 06/04/2018 Document Reviewed: 04/10/2018 Elsevier Patient Education  [place].

## 2020-08-19 NOTE — Progress Notes (Signed)
Subjective:   Patient ID: Michelle Wagner, female   DOB: 54 y.o.   MRN: [id number]   HPI 54 year old female presents the office today for concerns of nail fungus.  She previously was on ciclopirox which did help some but she has noticed the fungus has been coming back.  Denies any pain in the nails and denies any redness or drainage or any signs of infection.  She also has secondary concerns of bunions in both of her feet which are causing just some discomfort mostly with wearing certain shoes.  Right side is worse than left.  She denies recent injury or trauma.  She has tried changing shoes.  No other concerns.  Review of Systems  All other systems reviewed and are negative.  Past Medical History:  Diagnosis Date   Breast cancer ([place]) 10/12/2009   right lumpectomy herpos ert neg dcis multilocal invasionN0M0 rs herceptin radiation 2011   DCIS (ductal carcinoma in situ) of breast 01/09/13   left    GERD (gastroesophageal reflux disease)    H/O gastroesophageal reflux (GERD)    endoscopy   Headache(784.0)    History of radiation therapy 04/06/10-05/18/10   right breast   History of radiation therapy 03/07/13-04/05/13   left breast/ 52.72 total Gy/76fx   Hx of migraines    Personal history of radiation therapy    PONV (postoperative nausea and vomiting)     Past Surgical History:  Procedure Laterality Date   ABDOMINAL HYSTERECTOMY  2008   BREAST LUMPECTOMY Right 2010   right   BREAST LUMPECTOMY Left 2014   BREAST LUMPECTOMY WITH NEEDLE LOCALIZATION Left 02/06/2013   Procedure: BREAST LUMPECTOMY WITH NEEDLE LOCALIZATION;  Surgeon: Stark Klein, MD;  Location: [facility];  Service: General;  Laterality: Left;  left breast needle localization lumpectomy     Current Outpatient Medications:    acetaminophen (TYLENOL) 325 MG tablet, Take 325 mg by mouth daily as needed (for headache.)., Disp: , Rfl:    b complex vitamins capsule, Take 1 capsule by mouth daily., Disp: , Rfl:     Calcium Carbonate-Vitamin D (CALCIUM + D PO), Take 1 tablet by mouth daily., Disp: , Rfl:    Cholecalciferol (VITAMIN D3) 1000 UNITS CAPS, Take 1 capsule by mouth daily., Disp: , Rfl:    Efinaconazole 10 % SOLN, apply TOPICALLY to toenail qd  for 48 weeks; completely cover the toenail,and undersurface of toenail plate, Disp: 4 mL, Rfl: 1   Efinaconazole 10 % SOLN, Apply 1 drop topically daily., Disp: 4 mL, Rfl: 11   gabapentin (NEURONTIN) 300 MG capsule, Take 300 mg by mouth 2 (two) times daily., Disp: , Rfl:    ibuprofen (ADVIL,MOTRIN) 200 MG tablet, Take 200 mg by mouth every 6 (six) hours as needed for headache., Disp: , Rfl:    tamoxifen (NOLVADEX) 20 MG tablet, TAKE 1 TABLET BY MOUTH DAILY, Disp: 90 tablet, Rfl: 0  Allergies  Allergen Reactions   Pseudoephedrine Hcl Other (See Comments)    sleepless         Objective:  Physical Exam  General: AAO x3, NAD  Dermatological: Nails are mildly hypertrophic, dystrophic with yellow-brown discoloration.  There is no pain the nails there is no redness or drainage or any signs of infection.  There are no open lesions.  Vascular: Dorsalis Pedis artery and Posterior Tibial artery pedal pulses are 2/4 bilateral with immedate capillary fill time. There is no pain with calf compression, swelling, warmth, erythema.   Neruologic: Grossly intact via light touch  bilateral.   Musculoskeletal: Mild bunion deformities present of moderate severity.  No significant tenderness palpation the bunion site today.  No pain or crepitation with first MPJ range of motion.  Muscular strength 5/5 in all groups tested bilateral.  Gait: Unassisted, Nonantalgic.       Assessment:   Bilateral bunions, onychomycosis     Plan:  -Treatment options discussed including all alternatives, risks, and complications -Etiology of symptoms were discussed  1. Onychomycosis -Rx Jublia   2. B/L bunion -X-rays obtained reviewed.  Moderate bunion is present.  No  evidence of acute fracture. -Discussed with conservative as well as surgical treatment options.  We will continue with conservative treatment for now.  Discussion modifications and offloading pads and offloading pads were dispensed today.  Trula Slade DPM

## 2020-10-28 ENCOUNTER — Other Ambulatory Visit (HOSPITAL_COMMUNITY): Payer: Self-pay | Admitting: Internal Medicine

## 2020-10-28 MED FILL — GABAPENTIN 300 MG CAPSULE: 300 | 30 days supply | Qty: 90 | Fill #0

## 2020-11-09 MED FILL — GABAPENTIN 300 MG CAPSULE: 300 | 30 days supply | Qty: 90 | Fill #0

## 2020-11-09 MED FILL — TAMOXIFEN 20 MG TABLET: 20 | 90 days supply | Qty: 90 | Fill #1

## 2021-01-14 DIAGNOSIS — Z20822 Contact with and (suspected) exposure to covid-19: Secondary | ICD-10-CM | POA: Diagnosis not present

## 2021-01-19 DIAGNOSIS — H52223 Regular astigmatism, bilateral: Secondary | ICD-10-CM | POA: Diagnosis not present

## 2021-01-19 DIAGNOSIS — H524 Presbyopia: Secondary | ICD-10-CM | POA: Diagnosis not present

## 2021-01-19 DIAGNOSIS — H5213 Myopia, bilateral: Secondary | ICD-10-CM | POA: Diagnosis not present

## 2021-01-19 DIAGNOSIS — H25813 Combined forms of age-related cataract, bilateral: Secondary | ICD-10-CM | POA: Diagnosis not present

## 2021-02-08 MED FILL — GABAPENTIN 300 MG CAPSULE: 300 | 30 days supply | Qty: 90 | Fill #1

## 2021-02-08 MED FILL — TAMOXIFEN 20 MG TABLET: 20 | 90 days supply | Qty: 90 | Fill #2

## 2021-04-08 ENCOUNTER — Ambulatory Visit: Payer: 59 | Admitting: Podiatry

## 2021-04-08 ENCOUNTER — Other Ambulatory Visit: Payer: Self-pay

## 2021-04-08 DIAGNOSIS — B351 Tinea unguium: Secondary | ICD-10-CM | POA: Diagnosis not present

## 2021-04-08 DIAGNOSIS — L608 Other nail disorders: Secondary | ICD-10-CM | POA: Diagnosis not present

## 2021-04-12 ENCOUNTER — Other Ambulatory Visit (HOSPITAL_COMMUNITY): Payer: Self-pay | Admitting: *Deleted

## 2021-04-12 NOTE — Progress Notes (Signed)
Subjective: 55 year old female presents the office today for follow-up evaluation of bilateral toenail discoloration, fungus.  She been using topical Jublia without significant improvement.  She denies any pain in the nails and no redness or drainage or any swelling. Denies any systemic complaints such as fevers, chills, nausea, vomiting. No acute changes since last appointment, and no other complaints at this time.   Objective: AAO x3, NAD DP/PT pulses palpable bilaterally, CRT less than 3 seconds Bilateral hallux nails continue to be dystrophic and discolored with yellow-brown discoloration.  There has been to be some slight Clearing on the proximal nail fold.  There is no pain in the nails there is no redness or drainage or any swelling or any signs of infection. No pain with calf compression, swelling, warmth, erythema  Assessment: Onychomycosis  Plan: -All treatment options discussed with the patient including all alternatives, risks, complications.  -Due to the continuation I sharply debrided the nails and sent this for culture, pathology to [facility] point further treatment. -Patient encouraged to call the office with any questions, concerns, change in symptoms.   Trula Slade DPM

## 2021-04-13 ENCOUNTER — Other Ambulatory Visit (HOSPITAL_BASED_OUTPATIENT_CLINIC_OR_DEPARTMENT_OTHER): Payer: Self-pay

## 2021-04-13 MED FILL — Tamoxifen Citrate Tab 20 MG (Base Equivalent): ORAL | 90 days supply | Qty: 90 | Fill #0 | Status: CN

## 2021-04-14 ENCOUNTER — Other Ambulatory Visit (HOSPITAL_COMMUNITY): Payer: Self-pay

## 2021-04-26 ENCOUNTER — Other Ambulatory Visit: Payer: Self-pay

## 2021-04-26 ENCOUNTER — Ambulatory Visit (HOSPITAL_BASED_OUTPATIENT_CLINIC_OR_DEPARTMENT_OTHER)
Admission: RE | Admit: 2021-04-26 | Discharge: 2021-04-26 | Disposition: A | Discharge status: Home / Self Care | Payer: Self-pay | Source: Ambulatory Visit | Attending: Cardiology | Admitting: Cardiology

## 2021-04-26 ENCOUNTER — Other Ambulatory Visit (HOSPITAL_COMMUNITY): Payer: Self-pay

## 2021-04-26 MED ORDER — TAMOXIFEN CITRATE 20 MG PO TABS
20.0000 mg | ORAL_TABLET | Freq: Every day | ORAL | 0 refills | Status: DC
  Filled 2021-04-26: qty 90, 90d supply, fill #0

## 2021-04-26 MED FILL — Gabapentin Cap 300 MG: ORAL | 30 days supply | Qty: 90 | Fill #0 | Status: AC

## 2021-05-03 ENCOUNTER — Other Ambulatory Visit (HOSPITAL_COMMUNITY): Payer: Self-pay

## 2021-05-20 ENCOUNTER — Other Ambulatory Visit (HOSPITAL_COMMUNITY): Payer: Self-pay

## 2021-05-20 MED FILL — Gabapentin Cap 300 MG: ORAL | 30 days supply | Qty: 90 | Fill #1 | Status: CN

## 2021-05-24 ENCOUNTER — Other Ambulatory Visit (HOSPITAL_COMMUNITY): Payer: Self-pay

## 2021-06-01 ENCOUNTER — Other Ambulatory Visit (HOSPITAL_COMMUNITY): Payer: Self-pay

## 2021-09-10 ENCOUNTER — Other Ambulatory Visit (HOSPITAL_COMMUNITY): Payer: Self-pay

## 2021-09-10 ENCOUNTER — Other Ambulatory Visit: Payer: Self-pay | Admitting: Internal Medicine

## 2021-09-10 DIAGNOSIS — Z1231 Encounter for screening mammogram for malignant neoplasm of breast: Secondary | ICD-10-CM

## 2021-09-10 MED ORDER — TAMOXIFEN CITRATE 20 MG PO TABS
20.0000 mg | ORAL_TABLET | Freq: Every day | ORAL | 0 refills | Status: DC
  Filled 2021-09-10: qty 90, 90d supply, fill #0

## 2021-09-10 MED FILL — Gabapentin Cap 300 MG: ORAL | 30 days supply | Qty: 90 | Fill #1 | Status: AC

## 2021-09-13 ENCOUNTER — Ambulatory Visit
Admission: RE | Admit: 2021-09-13 | Discharge: 2021-09-13 | Disposition: A | Discharge status: Home / Self Care | Payer: 59 | Source: Ambulatory Visit | Attending: Internal Medicine | Admitting: Internal Medicine

## 2021-09-13 ENCOUNTER — Other Ambulatory Visit: Payer: Self-pay

## 2021-09-13 DIAGNOSIS — Z1231 Encounter for screening mammogram for malignant neoplasm of breast: Secondary | ICD-10-CM | POA: Diagnosis not present

## 2021-10-06 ENCOUNTER — Other Ambulatory Visit (HOSPITAL_COMMUNITY): Payer: Self-pay

## 2021-10-06 MED ORDER — PAXLOVID (300/100) 20 X 150 MG & 10 X 100MG PO TBPK
ORAL_TABLET | Freq: Two times a day (BID) | ORAL | 0 refills | Status: DC
  Filled 2021-10-06: qty 30, 15d supply, fill #0

## 2021-11-19 ENCOUNTER — Other Ambulatory Visit (HOSPITAL_COMMUNITY): Payer: Self-pay

## 2021-11-19 MED ORDER — GABAPENTIN 300 MG PO CAPS
300.0000 mg | ORAL_CAPSULE | Freq: Three times a day (TID) | ORAL | 3 refills | Status: DC
  Filled 2021-11-19: qty 90, 30d supply, fill #0
  Filled 2022-01-19: qty 90, 30d supply, fill #1
  Filled 2022-05-10: qty 90, 30d supply, fill #2
  Filled 2022-08-24: qty 90, 30d supply, fill #3

## 2022-01-19 ENCOUNTER — Other Ambulatory Visit (HOSPITAL_COMMUNITY): Payer: Self-pay

## 2022-01-19 MED ORDER — ONDANSETRON 4 MG PO TBDP
4.0000 mg | ORAL_TABLET | Freq: Three times a day (TID) | ORAL | 0 refills | Status: AC | PRN
  Filled 2022-01-19: qty 42, 14d supply, fill #0

## 2022-05-10 ENCOUNTER — Other Ambulatory Visit (HOSPITAL_COMMUNITY): Payer: Self-pay

## 2022-05-11 ENCOUNTER — Other Ambulatory Visit (HOSPITAL_COMMUNITY): Payer: Self-pay

## 2022-05-11 MED ORDER — TAMOXIFEN CITRATE 20 MG PO TABS
20.0000 mg | ORAL_TABLET | Freq: Every day | ORAL | 0 refills | Status: DC
  Filled 2022-05-11: qty 90, 90d supply, fill #0

## 2022-08-16 ENCOUNTER — Encounter: Payer: Self-pay | Admitting: Internal Medicine

## 2022-08-16 ENCOUNTER — Telehealth (INDEPENDENT_AMBULATORY_CARE_PROVIDER_SITE_OTHER): Payer: 59 | Admitting: Internal Medicine

## 2022-08-16 VITALS — Ht 61.0 in | Wt 134.0 lb

## 2022-08-16 DIAGNOSIS — R931 Abnormal findings on diagnostic imaging of heart and coronary circulation: Secondary | ICD-10-CM | POA: Diagnosis not present

## 2022-08-16 DIAGNOSIS — C50919 Malignant neoplasm of unspecified site of unspecified female breast: Secondary | ICD-10-CM | POA: Diagnosis not present

## 2022-08-16 DIAGNOSIS — Z923 Personal history of irradiation: Secondary | ICD-10-CM | POA: Diagnosis not present

## 2022-08-16 DIAGNOSIS — Z79810 Long term (current) use of selective estrogen receptor modulators (SERMs): Secondary | ICD-10-CM

## 2022-08-16 DIAGNOSIS — Z79899 Other long term (current) drug therapy: Secondary | ICD-10-CM

## 2022-08-16 NOTE — Progress Notes (Signed)
Virtual Visit via Video Note  I connected with Michelle Wagner on 08/16/22 at 11:30 AM EDT by a video enabled telemedicine application and verified that I am speaking with the correct person using two identifiers. Location patient: home Location provider:home office Persons participating in the virtual visit: patient, provider  Patient aware  of the limitations of evaluation and management by telemedicine and  availability of in person appointments. and agreed to proceed.   HPI: Michelle Wagner presents for video visit had to change to telephone since she was not successful connecting to video link .  Last visit was  cpx 2020  Since then she has continued  tamoxifen for  breast cancer  control. Was on gabapentin for flushes but to try off.  Had Coronary calcium ct  last year 8 score 81 %ile ( lad) Fam hx :elevated lipids and ht  later cv father in 90s. Was advised to consider  5- 10  or rosuvastatin  Son age 23 recently dx  myotonic dystrophy genetically( father has gene but no family hx ) She will visit him in [place] soon    ROS: See pertinent positives and negatives per HPI.  Past Medical History:  Diagnosis Date   Breast cancer ([place]) 10/12/2009   right lumpectomy herpos ert neg dcis multilocal invasionN0M0 rs herceptin radiation 2011   DCIS (ductal carcinoma in situ) of breast 01/09/13   left    GERD (gastroesophageal reflux disease)    H/O gastroesophageal reflux (GERD)    endoscopy   Headache(784.0)    History of radiation therapy 04/06/10-05/18/10   right breast   History of radiation therapy 03/07/13-04/05/13   left breast/ 52.72 total Gy/26f   Hx of migraines    Personal history of radiation therapy    PONV (postoperative nausea and vomiting)     Past Surgical History:  Procedure Laterality Date   ABDOMINAL HYSTERECTOMY  2008   BREAST LUMPECTOMY Right 2010   right   BREAST LUMPECTOMY Left 2014   BREAST LUMPECTOMY WITH NEEDLE LOCALIZATION Left 02/06/2013    Procedure: BREAST LUMPECTOMY WITH NEEDLE LOCALIZATION;  Surgeon: FStark Klein MD;  Location: [facility];  Service: General;  Laterality: Left;  left breast needle localization lumpectomy    Family History  Problem Relation Age of Onset   Hyperthyroidism Mother    Graves' disease Mother    Diabetes Father    Hypertension Father    Eating disorder Daughter    Uterine cancer Maternal Aunt 88   Breast cancer Maternal Aunt        single mastectomy, no chemo; dx in her 760s  Stomach cancer Cousin 29       Klinefelter syndrome   Klinefelter's syndrome Cousin    Diabetes Mellitus II Cousin    Thalassemia Cousin        several paternal cousins with Beta Thal   Hypertension Brother    Hyperlipidemia Brother        one brother w/ hihg bp/ chol    Social History   Tobacco Use   Smoking status: Never   Smokeless tobacco: Never  Substance Use Topics   Alcohol use: No   Drug use: No      Current Outpatient Medications:    acetaminophen (TYLENOL) 325 MG tablet, Take 325 mg by mouth daily as needed (for headache.)., Disp: , Rfl:    Calcium Carbonate-Vitamin D (CALCIUM + D PO), Take 1 tablet by mouth daily., Disp: , Rfl:    Cholecalciferol (VITAMIN D3) 1000  UNITS CAPS, Take 1 capsule by mouth daily., Disp: , Rfl:    Levocetirizine Dihydrochloride (XYZAL PO), Take by mouth. PRN, Disp: , Rfl:    tamoxifen (NOLVADEX) 20 MG tablet, Take 1 tablet (20 mg total) by mouth daily., Disp: 90 tablet, Rfl: 0   b complex vitamins capsule, Take 1 capsule by mouth daily. (Patient not taking: Reported on 08/16/2022), Disp: , Rfl:    gabapentin (NEURONTIN) 300 MG capsule, Take 1 capsule (300 mg total) by mouth 3 (three) times daily. (Patient not taking: Reported on 08/16/2022), Disp: 90 capsule, Rfl: 3   ibuprofen (ADVIL,MOTRIN) 200 MG tablet, Take 200 mg by mouth every 6 (six) hours as needed for headache. (Patient not taking: Reported on 08/16/2022), Disp: , Rfl:    ondansetron (ZOFRAN-ODT) 4 MG disintegrating  tablet, Take 1 tablet (4 mg total) and place on top of the tongue where it will dissolve, then swallow every 8 (eight) hours as needed. (Patient not taking: Reported on 08/16/2022), Disp: 42 tablet, Rfl: 0   traZODone (DESYREL) 50 MG tablet, , Disp: , Rfl:   EXAM: BP Readings from Last 3 Encounters:  09/20/19 112/64  02/11/16 100/66  02/20/15 96/66    VITALS per patient if applicable:  GENERAL: alert, oriented, appears well and in no acute distress   PSYCH/NEURO: pleasant and cooperative, no obvious depression or anxiety, speech and thought processing grossly intact Lab Results  Component Value Date   WBC 6.3 09/23/2019   HGB 13.2 09/23/2019   HCT 38.6 09/23/2019   PLT 206.0 09/23/2019   GLUCOSE 87 09/23/2019   CHOL 152 09/23/2019   TRIG 137.0 09/23/2019   HDL 54.70 09/23/2019   LDLCALC 70 09/23/2019   ALT 16 09/23/2019   AST 17 09/23/2019   NA 141 09/23/2019   K 4.0 09/23/2019   CL 105 09/23/2019   CREATININE 0.73 09/23/2019   BUN 13 09/23/2019   CO2 28 09/23/2019   TSH 2.15 09/23/2019   INR 0.87 03/19/2010   HGBA1C 5.4 09/23/2019    ASSESSMENT AND PLAN:  Discussed the following assessment and plan:    ICD-10-CM   1. High coronary artery calcium score  E36.6 Basic metabolic panel    CBC with Differential/Platelet    Hepatic function panel    Lipid panel    TSH    Lipoprotein A (LPA)   83 %ile    2. Malignant neoplasm of female breast, unspecified estrogen receptor status, unspecified laterality, unspecified site of breast ([place])  Q94.765 Basic metabolic panel    CBC with Differential/Platelet    Hepatic function panel    Lipid panel    TSH    Lipoprotein A (LPA)    3. Use of tamoxifen (Nolvadex)  Y65.035 Basic metabolic panel    CBC with Differential/Platelet    Hepatic function panel    Lipid panel    TSH    Lipoprotein A (LPA)    4. History of radiation therapy  W65.6 Basic metabolic panel    CBC with Differential/Platelet    Hepatic function panel     Lipid panel    TSH    Lipoprotein A (LPA)   chest ct minimal radiation upper lobes ( incidental finding on ct calcium score)    5. Medication management  C12.751 Basic metabolic panel    CBC with Differential/Platelet    Hepatic function panel    Lipid panel    TSH    Lipoprotein A (LPA)    Plan fasting labs to include  lipo a  Then consider  add statin.    Expectant management and discussion of plan and treatment with opportunity to ask questions and all were answered. The patient agreed with the plan and demonstrated an understanding of the instructions.  22 minutes  non face to face.  Advised to call back or seek an in-person evaluation if worsening  or having  further concerns  in interim. Return for lab appt,  then in person cpx in October  when convenient . Marland Kitchen   Shanon Ace, MD

## 2022-08-24 ENCOUNTER — Other Ambulatory Visit (HOSPITAL_COMMUNITY): Payer: Self-pay

## 2022-08-25 ENCOUNTER — Other Ambulatory Visit (HOSPITAL_COMMUNITY): Payer: Self-pay

## 2022-08-25 MED ORDER — TAMOXIFEN CITRATE 20 MG PO TABS
20.0000 mg | ORAL_TABLET | Freq: Every day | ORAL | 0 refills | Status: AC
  Filled 2022-08-25 – 2023-01-24 (×2): qty 90, 90d supply, fill #0

## 2022-09-06 ENCOUNTER — Other Ambulatory Visit (HOSPITAL_COMMUNITY): Payer: Self-pay

## 2022-09-08 ENCOUNTER — Other Ambulatory Visit (INDEPENDENT_AMBULATORY_CARE_PROVIDER_SITE_OTHER): Payer: 59

## 2022-09-08 ENCOUNTER — Telehealth: Payer: Self-pay | Admitting: Internal Medicine

## 2022-09-08 DIAGNOSIS — Z79899 Other long term (current) drug therapy: Secondary | ICD-10-CM

## 2022-09-08 DIAGNOSIS — Z79810 Long term (current) use of selective estrogen receptor modulators (SERMs): Secondary | ICD-10-CM | POA: Diagnosis not present

## 2022-09-08 DIAGNOSIS — R931 Abnormal findings on diagnostic imaging of heart and coronary circulation: Secondary | ICD-10-CM

## 2022-09-08 DIAGNOSIS — C50919 Malignant neoplasm of unspecified site of unspecified female breast: Secondary | ICD-10-CM

## 2022-09-08 DIAGNOSIS — Z923 Personal history of irradiation: Secondary | ICD-10-CM

## 2022-09-08 LAB — BASIC METABOLIC PANEL
BUN: 14 mg/dL (ref 6–23)
CO2: 28 mEq/L (ref 19–32)
Calcium: 9 mg/dL (ref 8.4–10.5)
Chloride: 105 mEq/L (ref 96–112)
Creatinine, Ser: 0.77 mg/dL (ref 0.40–1.20)
GFR: 86.45 mL/min (ref >60.00)
Glucose, Bld: 101 mg/dL — ABNORMAL HIGH (ref 70–99)
Potassium: 4.4 mEq/L (ref 3.5–5.1)
Sodium: 140 mEq/L (ref 135–145)

## 2022-09-08 LAB — CBC WITH DIFFERENTIAL/PLATELET
Basophils Absolute: 0.1 10*3/uL (ref 0.0–0.1)
Basophils Relative: 0.9 % (ref 0.0–3.0)
Eosinophils Absolute: 0.2 10*3/uL (ref 0.0–0.7)
Eosinophils Relative: 2.3 % (ref 0.0–5.0)
HCT: 39.6 % (ref 36.0–46.0)
Hemoglobin: 13.4 g/dL (ref 12.0–15.0)
Lymphocytes Relative: 41.3 % (ref 12.0–46.0)
Lymphs Abs: 2.7 10*3/uL (ref 0.7–4.0)
MCHC: 33.9 g/dL (ref 30.0–36.0)
MCV: 86.4 fl (ref 78.0–100.0)
Monocytes Absolute: 0.3 10*3/uL (ref 0.1–1.0)
Monocytes Relative: 3.9 % (ref 3.0–12.0)
Neutro Abs: 3.4 10*3/uL (ref 1.4–7.7)
Neutrophils Relative %: 51.6 % (ref 43.0–77.0)
Platelets: 208 10*3/uL (ref 150.0–400.0)
RBC: 4.58 Mil/uL (ref 3.87–5.11)
RDW: 12.7 % (ref 11.5–15.5)
WBC: 6.6 10*3/uL (ref 4.0–10.5)

## 2022-09-08 LAB — HEPATIC FUNCTION PANEL
ALT: 16 U/L (ref 0–35)
AST: 15 U/L (ref 0–37)
Albumin: 4 g/dL (ref 3.5–5.2)
Alkaline Phosphatase: 63 U/L (ref 39–117)
Bilirubin, Direct: 0.1 mg/dL (ref 0.0–0.3)
Total Bilirubin: 0.4 mg/dL (ref 0.2–1.2)
Total Protein: 7.2 g/dL (ref 6.0–8.3)

## 2022-09-08 LAB — LIPID PANEL
Cholesterol: 169 mg/dL (ref 0–200)
HDL: 64.9 mg/dL
LDL Cholesterol: 78 mg/dL (ref 0–99)
NonHDL: 103.96
Total CHOL/HDL Ratio: 3
Triglycerides: 130 mg/dL (ref 0.0–149.0)
VLDL: 26 mg/dL (ref 0.0–40.0)

## 2022-09-08 LAB — TSH: TSH: 2.83 u[IU]/mL (ref 0.35–5.50)

## 2022-09-08 NOTE — Telephone Encounter (Signed)
Patient requesting an A1C be added to the labwork that was drawn today

## 2022-09-08 NOTE — Telephone Encounter (Addendum)
Last OV in 2020. Pt can have POC A1c at upcoming [facility] on 10/10/22. Pt notified of above & verb understanding.

## 2022-09-13 LAB — LIPOPROTEIN A (LPA): Lipoprotein (a): <10 nmol/L (ref <75)

## 2022-09-25 NOTE — Progress Notes (Signed)
Results normal  x borderline blood sugar  but no diabetes . Will review at upcoming visit

## 2022-10-09 NOTE — Progress Notes (Unsigned)
No chief complaint on file.   HPI: Patient  Michelle Wagner  56 y.o. comes in today for Preventive Health Care visit   Health Maintenance  Topic Date Due   COVID-19 Vaccine (1) Never done   HIV Screening  Never done   Hepatitis C Screening  Never done   Zoster Vaccines- Shingrix (1 of 2) Never done   TETANUS/TDAP  09/28/2020   INFLUENZA VACCINE  07/12/2022   MAMMOGRAM  09/13/2022   COLONOSCOPY (Pts 45-14yr Insurance coverage will need to be confirmed)  05/07/2025   HPV VACCINES  Aged Out   Health Maintenance Review LIFESTYLE:  Exercise:   Tobacco/ETS: Alcohol:  Sugar beverages: Sleep: Drug use: no HH of  Work:    ROS:  GEN/ HEENT: No fever, significant weight changes sweats headaches vision problems hearing changes, CV/ PULM; No chest pain shortness of breath cough, syncope,edema  change in exercise tolerance. GI /GU: No adominal pain, vomiting, change in bowel habits. No blood in the stool. No significant GU symptoms. SKIN/HEME: ,no acute skin rashes suspicious lesions or bleeding. No lymphadenopathy, nodules, masses.  NEURO/ PSYCH:  No neurologic signs such as weakness numbness. No depression anxiety. IMM/ Allergy: No unusual infections.  Allergy .   REST of 12 system review negative except as per HPI   Past Medical History:  Diagnosis Date   [place]   right lumpectomy herpos ert neg dcis multilocal invasionN0M0 rs herceptin radiation 2011   DCIS (ductal carcinoma in situ) of breast 01/09/13   left    GERD (gastroesophageal reflux disease)    H/O gastroesophageal reflux (GERD)    endoscopy   Headache(784.0)    History of radiation therapy 04/06/10-05/18/10   right breast   History of radiation therapy 03/07/13-04/05/13   left breast/ 52.72 total Gy/245f  Hx of migraines    Personal history of radiation therapy    PONV (postoperative nausea and vomiting)     Past Surgical History:  Procedure Laterality Date   ABDOMINAL HYSTERECTOMY   2008   BREAST LUMPECTOMY Right 2010   right   BREAST LUMPECTOMY Left 2014   BREAST LUMPECTOMY WITH NEEDLE LOCALIZATION Left 02/06/2013   Procedure: BREAST LUMPECTOMY WITH NEEDLE LOCALIZATION;  Surgeon: FaStark KleinMD;  Location: [facility] OR;  Service: General;  Laterality: Left;  left breast needle localization lumpectomy    Family History  Problem Relation Age of Onset   Hyperthyroidism Mother    Graves' disease Mother    Diabetes Father    Hypertension Father    Eating disorder Daughter    Uterine cancer Maternal Aunt 88   Breast cancer Maternal Aunt        single mastectomy, no chemo; dx in her 7059s Stomach cancer Cousin 29       Klinefelter syndrome   Klinefelter's syndrome Cousin    Diabetes Mellitus II Cousin    Thalassemia Cousin        several paternal cousins with Beta Thal   Hypertension Brother    Hyperlipidemia Brother        one brother w/ hihg bp/ chol    Social History   Socioeconomic History   Marital status: Married    Spouse name: Not on file   Number of children: Not on file   Years of education: Not on file   Highest education level: Not on file  Occupational History   Not on file  Tobacco Use   Smoking status: Never   Smokeless  tobacco: Never  Substance and Sexual Activity   Alcohol use: No   Drug use: No   Sexual activity: Yes  Other Topics Concern   Not on file  Social History Narrative   hh of  7  Husband children and MInLAW   Pet 1 cats.    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     b complex vitamins capsule Take 1 capsule by mouth daily. (Patient not taking: Reported on 08/16/2022)     Calcium Carbonate-Vitamin D (CALCIUM + D PO) Take 1 tablet by mouth daily.     Cholecalciferol (VITAMIN D3) 1000 UNITS CAPS Take 1 capsule by mouth daily.     gabapentin (NEURONTIN) 300 MG capsule Take 1 capsule (300 mg total) by mouth 3 (three) times daily. (Patient not taking: Reported on 08/16/2022) 90 capsule 3   ibuprofen (ADVIL,MOTRIN) 200 MG tablet Take 200 mg by mouth every 6 (six) hours as needed for headache. (Patient not taking: Reported on 08/16/2022)     Levocetirizine Dihydrochloride (XYZAL PO) Take by mouth. PRN     ondansetron (ZOFRAN-ODT) 4 MG disintegrating tablet Take 1 tablet (4 mg total) and place on top of the tongue where it will dissolve, then swallow every 8 (eight) hours as needed. (Patient not taking: Reported on 08/16/2022) 42 tablet 0   tamoxifen (NOLVADEX) 20 MG tablet Take 1 tablet (20 mg total) by mouth daily. 90 tablet 0   traZODone (DESYREL) 50 MG tablet  (Patient not taking: Reported on 08/16/2022)     No facility-administered medications prior to visit.     EXAM:  There were no vitals taken for this visit.  There is no height or weight on file to calculate BMI. Wt Readings from Last 3 Encounters:  08/16/22 134 lb (60.8 kg)  09/20/19 135 lb 9.6 oz (61.5 kg)  02/11/16 125 lb 1.6 oz (56.7 kg)    Physical Exam: Vital signs reviewed ZSW:FUXN is a well-developed well-nourished alert cooperative    who appearsr stated age in no acute distress.  HEENT: normocephalic atraumatic , Eyes: PERRL EOM's full, conjunctiva clear, Nares: paten,t no deformity discharge or tenderness., Ears: no deformity EAC's clear TMs with normal landmarks. Mouth: clear OP, no lesions, edema.  Moist mucous membranes. Dentition in adequate repair. NECK: supple without masses, thyromegaly or bruits. CHEST/PULM:  Clear to  auscultation and percussion breath sounds equal no wheeze , rales or rhonchi. No chest wall deformities or tenderness. Breast: normal by inspection . No dimpling, discharge, masses, tenderness or discharge . CV: PMI is nondisplaced, S1 S2 no gallops, murmurs, rubs. Peripheral pulses are full without delay.No JVD .  ABDOMEN: Bowel sounds normal nontender  No guard or rebound, no hepato splenomegal no CVA tenderness.  No hernia. Extremtities:  No clubbing cyanosis or edema, no acute joint swelling or redness no focal atrophy  NEURO:  Oriented x3, cranial nerves 3-12 appear to be intact, no obvious focal weakness,gait within normal limits no abnormal reflexes or asymmetrical SKIN: No acute rashes normal turgor, color, no bruising or petechiae. PSYCH: Oriented, good eye contact, no obvious depression anxiety, cognition and judgment appear normal. LN: no cervical axillary inguinal adenopathy  Lab Results  Component Value Date   WBC 6.6 09/08/2022   HGB 13.4 09/08/2022   HCT 39.6 09/08/2022   PLT 208.0 09/08/2022   GLUCOSE 101 (H) 09/08/2022   CHOL 169 09/08/2022   TRIG 130.0 09/08/2022   HDL 64.90 09/08/2022   LDLCALC 78 09/08/2022   ALT 16 09/08/2022   AST 15 09/08/2022   NA 140 09/08/2022   K 4.4 09/08/2022   CL 105 09/08/2022   CREATININE 0.77 09/08/2022   BUN 14 09/08/2022   CO2 28 09/08/2022   TSH 2.83 09/08/2022   INR 0.87 03/19/2010   HGBA1C 5.4 09/23/2019    BP Readings from Last 3 Encounters:  09/20/19 112/64  02/11/16 100/66  02/20/15 96/66    Lab reviewed with patient   ASSESSMENT AND PLAN:  Discussed the following assessment and plan:    ICD-10-CM   1. Visit for preventive health examination  Z00.00     2. Use of tamoxifen (Nolvadex)  Z79.810     3. Malignant neoplasm of female breast, unspecified estrogen receptor status, unspecified laterality, unspecified site of breast ([place])  C50.919     4. High coronary artery calcium score  R93.1      No follow-ups  on file.  Patient Care Team: Karime Scheuermann, Standley Brooking, MD as PCP - General Servando Salina, MD as Attending Physician (Obstetrics and Gynecology) Marcy Panning, MD (Hematology and Oncology) There are no Patient Instructions on file for this visit.  Standley Brooking. Yates Weisgerber M.D.

## 2022-10-10 ENCOUNTER — Other Ambulatory Visit (HOSPITAL_COMMUNITY): Payer: Self-pay

## 2022-10-10 ENCOUNTER — Encounter: Payer: Self-pay | Admitting: Internal Medicine

## 2022-10-10 ENCOUNTER — Ambulatory Visit (INDEPENDENT_AMBULATORY_CARE_PROVIDER_SITE_OTHER): Payer: 59 | Admitting: Internal Medicine

## 2022-10-10 VITALS — BP 104/70 | HR 89 | Temp 98.3°F | Ht 61.0 in | Wt 139.6 lb

## 2022-10-10 DIAGNOSIS — Z79810 Long term (current) use of selective estrogen receptor modulators (SERMs): Secondary | ICD-10-CM

## 2022-10-10 DIAGNOSIS — Z23 Encounter for immunization: Secondary | ICD-10-CM | POA: Diagnosis not present

## 2022-10-10 DIAGNOSIS — B351 Tinea unguium: Secondary | ICD-10-CM

## 2022-10-10 DIAGNOSIS — R931 Abnormal findings on diagnostic imaging of heart and coronary circulation: Secondary | ICD-10-CM

## 2022-10-10 DIAGNOSIS — C50919 Malignant neoplasm of unspecified site of unspecified female breast: Secondary | ICD-10-CM

## 2022-10-10 DIAGNOSIS — R739 Hyperglycemia, unspecified: Secondary | ICD-10-CM | POA: Diagnosis not present

## 2022-10-10 DIAGNOSIS — Z0000 Encounter for general adult medical examination without abnormal findings: Secondary | ICD-10-CM | POA: Diagnosis not present

## 2022-10-10 LAB — POCT GLYCOSYLATED HEMOGLOBIN (HGB A1C): Hemoglobin A1C: 5.4 % (ref 4.0–5.6)

## 2022-10-10 MED ORDER — TERBINAFINE HCL 250 MG PO TABS
250.0000 mg | ORAL_TABLET | Freq: Every day | ORAL | 2 refills | Status: DC
  Filled 2022-10-10: qty 30, 30d supply, fill #0
  Filled 2022-11-14: qty 30, 30d supply, fill #1
  Filled 2022-12-16: qty 30, 30d supply, fill #2

## 2022-10-10 NOTE — Patient Instructions (Addendum)
Good to see you .  Tdap and shingrix  vaccine #1   Can do terbinafine . For toenail fungus.   For 3 months off tamoxifen  Attention to lifestyle healthy eating and activity  exercise   A1c is 5.4 today .

## 2022-10-11 NOTE — Addendum Note (Signed)
Addended by: Encarnacion Slates on: 10/11/2022 08:33 AM   Modules accepted: Orders

## 2022-11-14 ENCOUNTER — Other Ambulatory Visit (HOSPITAL_COMMUNITY): Payer: Self-pay

## 2022-11-14 ENCOUNTER — Other Ambulatory Visit: Payer: Self-pay | Admitting: Internal Medicine

## 2022-11-16 ENCOUNTER — Other Ambulatory Visit (HOSPITAL_COMMUNITY): Payer: Self-pay

## 2022-11-18 ENCOUNTER — Ambulatory Visit
Admission: RE | Admit: 2022-11-18 | Discharge: 2022-11-18 | Disposition: A | Discharge status: Home / Self Care | Payer: 59 | Source: Ambulatory Visit | Attending: Internal Medicine | Admitting: Internal Medicine

## 2022-11-18 ENCOUNTER — Other Ambulatory Visit (HOSPITAL_COMMUNITY): Payer: Self-pay

## 2022-11-18 ENCOUNTER — Other Ambulatory Visit: Payer: Self-pay

## 2022-11-18 ENCOUNTER — Other Ambulatory Visit: Payer: Self-pay | Admitting: Internal Medicine

## 2022-11-18 DIAGNOSIS — Z1231 Encounter for screening mammogram for malignant neoplasm of breast: Secondary | ICD-10-CM | POA: Diagnosis not present

## 2022-11-18 MED ORDER — GABAPENTIN 300 MG PO CAPS
300.0000 mg | ORAL_CAPSULE | Freq: Three times a day (TID) | ORAL | 6 refills | Status: AC
  Filled 2022-11-18: qty 90, 30d supply, fill #0
  Filled 2022-12-16: qty 90, 30d supply, fill #1
  Filled 2023-01-24: qty 90, 30d supply, fill #2

## 2023-01-12 ENCOUNTER — Other Ambulatory Visit (HOSPITAL_COMMUNITY): Payer: Self-pay

## 2023-01-12 DIAGNOSIS — K219 Gastro-esophageal reflux disease without esophagitis: Secondary | ICD-10-CM | POA: Diagnosis not present

## 2023-01-12 DIAGNOSIS — Z1211 Encounter for screening for malignant neoplasm of colon: Secondary | ICD-10-CM | POA: Diagnosis not present

## 2023-01-12 DIAGNOSIS — R140 Abdominal distension (gaseous): Secondary | ICD-10-CM | POA: Diagnosis not present

## 2023-01-12 DIAGNOSIS — K5900 Constipation, unspecified: Secondary | ICD-10-CM | POA: Diagnosis not present

## 2023-01-12 MED ORDER — PANTOPRAZOLE SODIUM 40 MG PO TBEC
40.0000 mg | DELAYED_RELEASE_TABLET | Freq: Every morning | ORAL | 4 refills | Status: DC
  Filled 2023-01-12 – 2023-01-24 (×2): qty 90, 90d supply, fill #0

## 2023-01-23 ENCOUNTER — Other Ambulatory Visit (HOSPITAL_COMMUNITY): Payer: Self-pay

## 2023-01-24 ENCOUNTER — Other Ambulatory Visit (HOSPITAL_COMMUNITY): Payer: Self-pay

## 2023-03-23 ENCOUNTER — Other Ambulatory Visit (HOSPITAL_COMMUNITY): Payer: Self-pay

## 2023-03-23 ENCOUNTER — Ambulatory Visit (INDEPENDENT_AMBULATORY_CARE_PROVIDER_SITE_OTHER): Payer: Commercial Managed Care - PPO

## 2023-03-23 ENCOUNTER — Encounter: Payer: Self-pay | Admitting: Adult Health

## 2023-03-23 ENCOUNTER — Ambulatory Visit: Payer: Commercial Managed Care - PPO | Admitting: Adult Health

## 2023-03-23 VITALS — BP 100/60 | HR 85 | Temp 98.9°F | Ht 61.0 in | Wt 139.0 lb

## 2023-03-23 DIAGNOSIS — J988 Other specified respiratory disorders: Secondary | ICD-10-CM | POA: Diagnosis not present

## 2023-03-23 DIAGNOSIS — R059 Cough, unspecified: Secondary | ICD-10-CM | POA: Diagnosis not present

## 2023-03-23 DIAGNOSIS — R0602 Shortness of breath: Secondary | ICD-10-CM | POA: Diagnosis not present

## 2023-03-23 DIAGNOSIS — R062 Wheezing: Secondary | ICD-10-CM | POA: Diagnosis not present

## 2023-03-23 LAB — CBC
HCT: 38.1 % (ref 36.0–46.0)
Hemoglobin: 12.9 g/dL (ref 12.0–15.0)
MCHC: 33.9 g/dL (ref 30.0–36.0)
MCV: 84.5 fl (ref 78.0–100.0)
Platelets: 289 10*3/uL (ref 150.0–400.0)
RBC: 4.51 Mil/uL (ref 3.87–5.11)
RDW: 13.4 % (ref 11.5–15.5)
WBC: 8.4 10*3/uL (ref 4.0–10.5)

## 2023-03-23 MED ORDER — DOXYCYCLINE HYCLATE 100 MG PO CAPS
100.0000 mg | ORAL_CAPSULE | Freq: Two times a day (BID) | ORAL | 0 refills | Status: DC
  Filled 2023-03-23: qty 14, 7d supply, fill #0

## 2023-03-23 MED ORDER — PREDNISONE 10 MG PO TABS
ORAL_TABLET | ORAL | 0 refills | Status: AC
  Filled 2023-03-23: qty 21, 9d supply, fill #0

## 2023-03-23 NOTE — Patient Instructions (Addendum)
It was great seeing you today   We will check a chest xray and CBC today   I will send in Doxycycline and prednisone taper.

## 2023-03-23 NOTE — Progress Notes (Signed)
Subjective:    Patient ID: Michelle Wagner, female    DOB: 1965/12/25, 57 y.o.   MRN: [id number]  Sinusitis    57 year old female who  has a past medical history of Breast cancer (10/12/2009), DCIS (ductal carcinoma in situ) of breast (01/09/13), GERD (gastroesophageal reflux disease), H/O gastroesophageal reflux (GERD), Headache(784.0), History of radiation therapy (04/06/10-05/18/10), History of radiation therapy (03/07/13-04/05/13), migraines, Personal history of radiation therapy, and PONV (postoperative nausea and vomiting).  She presents to the office today for an acute issue  She reports that her symptoms have been present for about three weeks. She started with a dry cough and then turned into slightly productive cough. She has shortness of breath with exertion and wheezing. Feels as though her test is tight and it is hard to exhale completely.  She has had some nasal congestion as well but this has been improving with flonase.   About a week ago she took a 4 day course of prednisone. This helped but symptoms have started to return   She has not had any fevers or chills   At home has been using Delsym to help with cough and this is helpful   Review of Systems See HPI   Past Medical History:  Diagnosis Date   Breast cancer 10/12/2009   right lumpectomy herpos ert neg dcis multilocal invasionN0M0 rs herceptin radiation 2011   DCIS (ductal carcinoma in situ) of breast 01/09/13   left    GERD (gastroesophageal reflux disease)    H/O gastroesophageal reflux (GERD)    endoscopy   Headache(784.0)    History of radiation therapy 04/06/10-05/18/10   right breast   History of radiation therapy 03/07/13-04/05/13   left breast/ 52.72 total Gy/75fx   Hx of migraines    Personal history of radiation therapy    PONV (postoperative nausea and vomiting)     Social History   Socioeconomic History   Marital status: Married    Spouse name: Not on file   Number of children: Not on file   Years of  education: Not on file   Highest education level: Not on file  Occupational History   Not on file  Tobacco Use   Smoking status: Never   Smokeless tobacco: Never  Substance and Sexual Activity   Alcohol use: No   Drug use: No   Sexual activity: Yes  Other Topics Concern   Not on file  Social History Narrative   hh of  7  Husband children and MInLAW   Pet 1 cats.    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 around                Social Determinants of Health   Financial Resource Strain: Not on file  Food Insecurity: Not on file  Transportation Needs: Not on file  Physical Activity: Not on file  Stress: Not on file  Social Connections: Not on file  Intimate Partner Violence: Not on file    Past Surgical History:  Procedure Laterality Date   ABDOMINAL HYSTERECTOMY  2008   BREAST LUMPECTOMY Right 2010   right   BREAST LUMPECTOMY Left 2014   BREAST LUMPECTOMY WITH NEEDLE LOCALIZATION Left 02/06/2013   Procedure: BREAST LUMPECTOMY WITH NEEDLE LOCALIZATION;  Surgeon: Almond Lint, MD;  Location: [facility] OR;  Service: General;  Laterality: Left;  left breast needle localization lumpectomy    Family History  Problem Relation Age of Onset   Hyperthyroidism Mother    Luiz Blare' disease Mother    Diabetes Father    Hypertension Father    Eating disorder Daughter    Uterine cancer Maternal Aunt 23   Breast cancer Maternal Aunt        single mastectomy, no chemo; dx in her 49s   Stomach cancer Cousin 29       Klinefelter syndrome   Klinefelter's syndrome Cousin    Diabetes Mellitus II Cousin    Thalassemia Cousin        several paternal cousins with Beta Thal   Hypertension Brother    Hyperlipidemia Brother        one brother w/ hihg bp/ chol    Allergies  Allergen Reactions   Pseudoephedrine Hcl Other (See Comments)    sleepless    Current Outpatient Medications on  File Prior to Visit  Medication Sig Dispense Refill   acetaminophen (TYLENOL) 325 MG tablet Take 325 mg by mouth daily as needed (for headache.).     b complex vitamins capsule Take 1 capsule by mouth daily. PRN     Calcium Carbonate-Vitamin D (CALCIUM + D PO) Take 1 tablet by mouth daily. PRN     Cholecalciferol (VITAMIN D3) 1000 UNITS CAPS Take 1 capsule by mouth daily.     esomeprazole (NEXIUM) 20 MG capsule Take 20 mg by mouth daily at 12 noon. 1 tab a day     gabapentin (NEURONTIN) 300 MG capsule Take 1 capsule (300 mg total) by mouth 3 (three) times daily. 90 capsule 6   ibuprofen (ADVIL,MOTRIN) 200 MG tablet Take 200 mg by mouth every 6 (six) hours as needed for headache.     Levocetirizine Dihydrochloride (XYZAL PO) Take by mouth. PRN     ondansetron (ZOFRAN-ODT) 4 MG disintegrating tablet Take 1 tablet (4 mg total) and place on top of the tongue where it will dissolve, then swallow every 8 (eight) hours as needed. 42 tablet 0   pantoprazole (PROTONIX) 40 MG tablet Take 1 tablet (40 mg total) by mouth in the morning 20 minutes before breakfast. 90 tablet 4   tamoxifen (NOLVADEX) 20 MG tablet Take 1 tablet (20 mg total) by mouth daily. 90 tablet 0   terbinafine (LAMISIL) 250 MG tablet Take 1 tablet (250 mg total) by mouth daily. Stop taking tamoxifen when on medication. 30 tablet 2   traZODone (DESYREL) 50 MG tablet      No current facility-administered medications on file prior to visit.    BP 100/60   Pulse 85   Temp 98.9 F (37.2 C) (Oral)   Ht  (1.549 m)   Wt 139 lb (63 kg)   SpO2 98%   BMI 26.26 kg/m       Objective:   Physical Exam Vitals and nursing note reviewed.  Constitutional:      Appearance: Normal appearance.  HENT:     Nose: Rhinorrhea present. No congestion. Rhinorrhea is clear.     Right Turbinates: Swollen. Not enlarged.     Left Turbinates: Swollen. Not enlarged.  Cardiovascular:     Rate and Rhythm: Normal rate and regular rhythm.     Pulses:  Normal pulses.     Heart sounds: Normal heart sounds.  Pulmonary:     Effort: Pulmonary effort is normal.     Breath sounds: No stridor or decreased air movement. Examination of the right-upper  field reveals wheezing. Examination of the left-upper field reveals wheezing. Examination of the right-middle field reveals wheezing. Examination of the left-middle field reveals wheezing. Examination of the right-lower field reveals wheezing. Examination of the left-lower field reveals wheezing. Wheezing (trace expiratory wheezing throughout lung fields) present. No decreased breath sounds, rhonchi or rales.  Musculoskeletal:        General: Normal range of motion.  Skin:    General: Skin is warm and dry.  Neurological:     General: No focal deficit present.     Mental Status: She is alert and oriented to person, place, and time.  Psychiatric:        Mood and Affect: Mood normal.        Behavior: Behavior normal.        Thought Content: Thought content normal.        Judgment: Judgment normal.       Assessment & Plan:  1. Respiratory infection - likely bronchitis but due to duration of symptoms will check CBC and chest xray. Start on prednisone taper and 1 week of doxycyline.  - Follow up if no improvement in the next 2-3 days or sooner if fever develops  - doxycycline (VIBRAMYCIN) 100 MG capsule; Take 1 capsule (100 mg total) by mouth 2 (two) times daily.  Dispense: 14 capsule; Refill: 0 - predniSONE (DELTASONE) 10 MG tablet; 40 mg x 3 days, 20 mg x 3 days, 10 mg x 3 days  Dispense: 21 tablet; Refill: 0 - DG Chest 2 View; Future - CBC; Future  Shirline Freesory Rayvion Stumph, NP

## 2023-04-04 ENCOUNTER — Other Ambulatory Visit (HOSPITAL_COMMUNITY): Payer: Self-pay

## 2023-04-04 ENCOUNTER — Other Ambulatory Visit: Payer: Self-pay

## 2023-04-04 MED ORDER — MONTELUKAST SODIUM 10 MG PO TABS
10.0000 mg | ORAL_TABLET | Freq: Every day | ORAL | 2 refills | Status: AC
  Filled 2023-04-04 (×2): qty 30, 30d supply, fill #0

## 2023-05-17 ENCOUNTER — Other Ambulatory Visit (HOSPITAL_COMMUNITY): Payer: Self-pay

## 2023-05-17 ENCOUNTER — Encounter: Payer: Self-pay | Admitting: Dermatology

## 2023-05-17 ENCOUNTER — Ambulatory Visit: Payer: Commercial Managed Care - PPO | Admitting: Dermatology

## 2023-05-17 VITALS — BP 99/68 | HR 85

## 2023-05-17 DIAGNOSIS — R21 Rash and other nonspecific skin eruption: Secondary | ICD-10-CM | POA: Diagnosis not present

## 2023-05-17 DIAGNOSIS — Z1211 Encounter for screening for malignant neoplasm of colon: Secondary | ICD-10-CM | POA: Insufficient documentation

## 2023-05-17 DIAGNOSIS — R141 Gas pain: Secondary | ICD-10-CM | POA: Insufficient documentation

## 2023-05-17 MED ORDER — TACROLIMUS 0.1 % EX OINT
TOPICAL_OINTMENT | Freq: Two times a day (BID) | CUTANEOUS | 3 refills | Status: DC
  Filled 2023-05-17 (×2): qty 30, 15d supply, fill #0
  Filled 2023-07-14: qty 60, 30d supply, fill #1

## 2023-05-17 NOTE — Patient Instructions (Addendum)
-Check ANA labs -Advised to take a break from current face regimens, Continue to use bar soap to wash facial area (Vani). Slowly incorporate your regular skin productions (add 1 product per week)  -Recommended patch testing in the near future - Continue Daily Antihistamine -Start Tacrolimus ointment, Apply Topically 2 times daily  Due to recent changes in healthcare laws, you may see results of your pathology and/or laboratory studies on MyChart before the doctors have had a chance to review them. We understand that in some cases there may be results that are confusing or concerning to you. Please understand that not all results are received at the same time and often the doctors may need to interpret multiple results in order to provide you with the best plan of care or course of treatment. Therefore, we ask that you please give [place] 2 business days to thoroughly review all your results before contacting the office for clarification. Should we see a critical lab result, you will be contacted sooner.   If You Need Anything After Your Visit  If you have any questions or concerns for your doctor, please call our main line at [phone] If no one answers, please leave a voicemail as directed and we will return your call as soon as possible. Messages left after 4 pm will be answered the following business day.   You may also send [place] a message via MyChart. We typically respond to MyChart messages within 1-2 business days.  For prescription refills, please ask your pharmacy to contact our office. Our fax number is [phone].  If you have an urgent issue when the clinic is closed that cannot wait until the next business day, you can page your doctor at the number below.    Please note that while we do our best to be available for urgent issues outside of office hours, we are not available 24/7.   If you have an urgent issue and are unable to reach [place], you may choose to seek medical  care at your doctor's office, retail clinic, urgent care center, or emergency room.  If you have a medical emergency, please immediately call 911 or go to the emergency department. In the event of inclement weather, please call our main line at [phone] for an update on the status of any delays or closures.  Dermatology Medication Tips: Please keep the boxes that topical medications come in in order to help keep track of the instructions about where and how to use these. Pharmacies typically print the medication instructions only on the boxes and not directly on the medication tubes.   If your medication is too expensive, please contact our office at [phone] or send [place] a message through MyChart.   We are unable to tell what your co-pay for medications will be in advance as this is different depending on your insurance coverage. However, we may be able to find a substitute medication at lower cost or fill out paperwork to get insurance to cover a needed medication.   If a prior authorization is required to get your medication covered by your insurance company, please allow [place] 1-2 business days to complete this process.  Drug prices often vary depending on where the prescription is filled and some pharmacies may offer cheaper prices.  The website www.goodrx.com contains coupons for medications through different pharmacies. The prices here do not account for what the cost may be with help from insurance (it may be cheaper  with your insurance), but the website can give you the price if you did not use any insurance.  - You can print the associated coupon and take it with your prescription to the pharmacy.  - You may also stop by our office during regular business hours and pick up a GoodRx coupon card.  - If you need your prescription sent electronically to a different pharmacy, notify our office through [facility] or by phone at [phone]

## 2023-05-17 NOTE — Progress Notes (Signed)
   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 Patient reports the areas have been there for  3  month(s). She reports the areas are bothersome. She reports the areas start by burning then it gets dry.She has tried OTC Cortisone creams and that seems to help. She states that the areas have spread. Patient reports she has not previously been treated for these areas. Patient she has Hx of bx but was negative. Patient denies family history of skin cancer(s).    The following portions of the chart were reviewed this encounter and updated as appropriate: medications, allergies, medical history  Review of Systems:  No other skin or systemic complaints except as noted in HPI or Assessment and Plan.  Objective  Well appearing patient in no apparent distress; mood and affect are within normal limits.  A focused examination was performed of the following areas: Face  Relevant exam findings are noted in the Assessment and Plan.         Assessment & Plan    Rash Exam: Scaly Dry Erythema located on eyelids,upper lip, underneath chin   Differential diagnosis:  irritant contact dermatitis    Treatment Plan: -Check ANA labs (r/o lupus) -Advised to take a break from current face regimens, Continue to use bar soap to wash facial area (Vanicream). Slowly incorporate your regular skin productions (add 1 product per week)  -Recommended patch testing in the near future - Continue Daily Antihistamine -Start Tacrolimus ointment, Apply Topically 2 times daily  Return in about 2 months (around 07/17/2023) for Rash F/U.  Documentation: I have reviewed the above documentation for accuracy and completeness, and I agree with the above.  Stasia Cavalier, am acting as scribe for Langston Reusing, DO.  Langston Reusing, DO

## 2023-05-18 ENCOUNTER — Other Ambulatory Visit (HOSPITAL_COMMUNITY): Payer: Self-pay

## 2023-05-20 LAB — ANA,IFA RA DIAG PNL W/RFLX TIT/PATN
ANA Titer 1: NEGATIVE
Cyclic Citrullin Peptide Ab: 4 units (ref 0–19)
Rheumatoid fact SerPl-aCnc: <10 IU/mL (ref <14.0)

## 2023-05-31 ENCOUNTER — Other Ambulatory Visit (HOSPITAL_COMMUNITY): Payer: Self-pay

## 2023-06-01 ENCOUNTER — Other Ambulatory Visit (HOSPITAL_COMMUNITY): Payer: Self-pay

## 2023-07-12 ENCOUNTER — Other Ambulatory Visit (HOSPITAL_COMMUNITY): Payer: Self-pay

## 2023-07-14 ENCOUNTER — Other Ambulatory Visit (HOSPITAL_COMMUNITY): Payer: Self-pay

## 2023-07-26 ENCOUNTER — Ambulatory Visit: Payer: Commercial Managed Care - PPO | Admitting: Dermatology

## 2023-08-22 ENCOUNTER — Other Ambulatory Visit (HOSPITAL_COMMUNITY): Payer: Self-pay

## 2023-09-12 ENCOUNTER — Other Ambulatory Visit (HOSPITAL_COMMUNITY): Payer: Self-pay

## 2023-09-12 DIAGNOSIS — K219 Gastro-esophageal reflux disease without esophagitis: Secondary | ICD-10-CM | POA: Diagnosis not present

## 2023-09-12 DIAGNOSIS — R140 Abdominal distension (gaseous): Secondary | ICD-10-CM | POA: Diagnosis not present

## 2023-09-12 DIAGNOSIS — E66811 Obesity, class 1: Secondary | ICD-10-CM | POA: Diagnosis not present

## 2023-09-12 DIAGNOSIS — K5904 Chronic idiopathic constipation: Secondary | ICD-10-CM | POA: Diagnosis not present

## 2023-09-12 MED ORDER — FAMOTIDINE 40 MG PO TABS
40.0000 mg | ORAL_TABLET | Freq: Two times a day (BID) | ORAL | 4 refills | Status: AC
  Filled 2023-09-12: qty 180, 90d supply, fill #0

## 2023-09-12 MED ORDER — ESOMEPRAZOLE MAGNESIUM 40 MG PO CPDR
40.0000 mg | DELAYED_RELEASE_CAPSULE | Freq: Every day | ORAL | 4 refills | Status: DC
  Filled 2023-09-12 (×2): qty 90, 90d supply, fill #0
  Filled 2023-11-28: qty 90, 90d supply, fill #1
  Filled 2024-02-22: qty 90, 90d supply, fill #2
  Filled 2024-05-21: qty 90, 90d supply, fill #3
  Filled 2024-08-27: qty 90, 90d supply, fill #4

## 2023-09-13 ENCOUNTER — Other Ambulatory Visit (HOSPITAL_COMMUNITY): Payer: Self-pay

## 2023-09-27 ENCOUNTER — Other Ambulatory Visit (HOSPITAL_COMMUNITY): Payer: Self-pay

## 2023-10-24 ENCOUNTER — Other Ambulatory Visit: Payer: Self-pay

## 2023-10-24 MED ORDER — MENINGOCOCCAL A C Y&W-135 OLIG IM SOLN: 0.5000 mL | Freq: Once | INTRAMUSCULAR | 0 refills | Status: AC

## 2023-10-26 ENCOUNTER — Ambulatory Visit (INDEPENDENT_AMBULATORY_CARE_PROVIDER_SITE_OTHER): Payer: Commercial Managed Care - PPO

## 2023-10-26 ENCOUNTER — Telehealth: Payer: Self-pay | Admitting: Internal Medicine

## 2023-10-26 DIAGNOSIS — Z23 Encounter for immunization: Secondary | ICD-10-CM | POA: Diagnosis not present

## 2023-10-26 NOTE — Telephone Encounter (Addendum)
[place] and need meningitis vaccine  MenACWY for pt over 55 yrs today

## 2023-10-26 NOTE — Telephone Encounter (Signed)
Contacted pt. Inform her, she can get menveo vaccine per provider. Advise to schedule an appt for tomorrow. Pt states she is not able to tomorrow as she will be at work. Pt ask if okay to come today to get. Pt states she will arrive 4:30pm.   Inform her that would be okay and scheduled a nurse visit.   Advise pt to check in with front desk once arrive. Verbalized understanding.

## 2023-10-26 NOTE — Telephone Encounter (Signed)
Ok to give  [employer]

## 2023-11-28 ENCOUNTER — Other Ambulatory Visit: Payer: Self-pay

## 2023-11-28 ENCOUNTER — Other Ambulatory Visit (HOSPITAL_COMMUNITY): Payer: Self-pay

## 2023-12-26 NOTE — Progress Notes (Signed)
 Chief Complaint  Patient presents with   Annual Exam    Pt would like to discuss with provider getting bone scan. Off of tamoxifen  for 2-3 months.     HPI: Patient  Michelle Wagner  58 y.o. comes in today for Preventive Health Care visit   Ocass knee issues. Suprapatellar  off and on   Mom had osteoporosis  hx of steroid  due for dexa for her  gets mammo at breast center   Gi  on nexium  to do egd Dr Tova Fresh  eventually   Vit d 5000 2-3 x per week  Consdier adding low dose statin cause of elevated Ct calcium  score but no sx   Health Maintenance  Topic Date Due   COVID-19 Vaccine (1) Never done   HIV Screening  Never done   Hepatitis C Screening  Never done   MAMMOGRAM  06/25/2024 (Originally 11/19/2023)   Colonoscopy  05/07/2025   DTaP/Tdap/Td (3 - Td or Tdap) 10/10/2032   INFLUENZA VACCINE  Completed   Zoster Vaccines- Shingrix   Completed   HPV VACCINES  Aged Out   Health Maintenance Review LIFESTYLE:  Exercise:   Tobacco/ETS: n Alcohol: n Sugar beverages: no Sleep: about 7  Drug use: no HH of  3 no pets  Work: 12 hours  off and on    ROS:  GEN/ HEENT: No fever, significant weight changes sweats headaches vision problems hearing changes, CV/ PULM; No chest pain shortness of breath cough, syncope,edema  change in exercise tolerance. GI /GU: No adominal pain, vomiting, change in bowel habits. No blood in the stool. No significant GU symptoms. SKIN/HEME: ,no acute skin rashes suspicious lesions or bleeding. No lymphadenopathy, nodules, masses.  NEURO/ PSYCH:  No neurologic signs such as weakness numbness. No depression anxiety. IMM/ Allergy: No unusual infections.  Allergy .   REST of 12 system review negative except as per HPI   Past Medical History:  Diagnosis Date   Breast cancer (HCC) 10/12/2009   right lumpectomy herpos ert neg dcis multilocal invasionN0M0 rs herceptin radiation 2011   DCIS (ductal carcinoma in situ) of breast 01/09/13   left    GERD  (gastroesophageal reflux disease)    H/O gastroesophageal reflux (GERD)    endoscopy   Headache(784.0)    History of radiation therapy 04/06/10-05/18/10   right breast   History of radiation therapy 03/07/13-04/05/13   left breast/ 52.72 total Gy/17fx   Hx of migraines    Personal history of radiation therapy    PONV (postoperative nausea and vomiting)     Past Surgical History:  Procedure Laterality Date   ABDOMINAL HYSTERECTOMY  2008   BREAST LUMPECTOMY Right 2010   right   BREAST LUMPECTOMY Left 2014   BREAST LUMPECTOMY WITH NEEDLE LOCALIZATION Left 02/06/2013   Procedure: BREAST LUMPECTOMY WITH NEEDLE LOCALIZATION;  Surgeon: Lockie Rima, MD;  Location: [facility] OR;  Service: General;  Laterality: Left;  left breast needle localization lumpectomy    Family History  Problem Relation Age of Onset   Hyperthyroidism Mother    Graves' disease Mother    Diabetes Father    Hypertension Father    Eating disorder Daughter    Uterine cancer Maternal Aunt 47   Breast cancer Maternal Aunt        single mastectomy, no chemo; dx in her 82s   Stomach cancer Cousin 29       Klinefelter syndrome   Klinefelter's syndrome Cousin    Diabetes Mellitus II Cousin  Thalassemia Cousin        several paternal cousins with Beta Thal   Hypertension Brother    Hyperlipidemia Brother        one brother w/ hihg bp/ chol    Social History   Socioeconomic History   Marital status: Married    Spouse name: Not on file   Number of children: Not on file   Years of education: Not on file   Highest education level: Not on file  Occupational History   Not on file  Tobacco Use   Smoking status: Never   Smokeless tobacco: Never  Substance and Sexual Activity   Alcohol use: No   Drug use: No   Sexual activity: Yes  Other Topics Concern   Not on file  Social History Narrative   hh of  7  Husband children and MInLAW   Pet 1 cats.    Neg tobacco   Masters level education graduated  And residency med  school    G4P4   To work vol at hospital    Lots of driving her children around                Social Drivers of [employer] Resource Strain: Not on file  Food Insecurity: Not on file  Transportation Needs: Not on file  Physical Activity: Inactive (12/27/2023)   Exercise Vital Sign    Days of Exercise per Week: 0 days    Minutes of Exercise per Session: 0 min  Stress: No Stress Concern Present (12/27/2023)   [employer] of Occupational Health - Occupational Stress Questionnaire    Feeling of Stress : Not at all  Social Connections: Socially Integrated (12/27/2023)   Social Connection and Isolation Panel [NHANES]    Frequency of Communication with Friends and Family: More than three times a week    Frequency of Social Gatherings with Friends and Family: Once a week    Attends Religious Services: More than 4 times per year    Active Member of [employer] or Organizations: Yes    Attends Engineer, structural: More than 4 times per year    Marital Status: Married    Outpatient Medications Prior to Visit  Medication Sig Dispense Refill   acetaminophen  (TYLENOL ) 325 MG tablet Take 325 mg by mouth daily as needed (for headache.).     b complex vitamins capsule Take 1 capsule by mouth daily. PRN     Calcium  Carbonate-Vitamin D  (CALCIUM  + D PO) Take 1 tablet by mouth daily. PRN     Cholecalciferol (VITAMIN D3) 1000 UNITS CAPS Take 1 capsule by mouth daily.     esomeprazole  (NEXIUM ) 40 MG capsule Take 1 capsule (40 mg total) by mouth daily 20 minutes before breakfast. 90 capsule 4   famotidine  (PEPCID ) 40 MG tablet Take 1 tablet (40 mg total) by mouth 2 (two) times daily. (Patient taking differently: Take 40 mg by mouth 2 (two) times daily. PRN) 180 tablet 4   ibuprofen (ADVIL,MOTRIN) 200 MG tablet Take 200 mg by mouth every 6 (six) hours as needed for headache.     Levocetirizine Dihydrochloride (XYZAL PO) Take by mouth. PRN     Naproxen Sodium (ALEVE PO) Take by mouth.  With tylenol  PRN     tacrolimus  (PROTOPIC ) 0.1 % ointment Apply topically 2 (two) times daily. Until Areas clear 60 g 3   gabapentin  (NEURONTIN ) 300 MG capsule Take 1 capsule (300 mg total) by mouth 3 (three) times daily. (Patient  not taking: Reported on 12/27/2023) 90 capsule 6   montelukast  (SINGULAIR ) 10 MG tablet Take 1 tablet (10 mg total) by mouth daily. (Patient not taking: Reported on 12/27/2023) 30 tablet 2   ondansetron  (ZOFRAN -ODT) 4 MG disintegrating tablet Take 1 tablet (4 mg total) and place on top of the tongue where it will dissolve, then swallow every 8 (eight) hours as needed. (Patient not taking: Reported on 12/27/2023) 42 tablet 0   tamoxifen  (NOLVADEX ) 20 MG tablet Take 1 tablet (20 mg total) by mouth daily. (Patient not taking: Reported on 12/27/2023) 90 tablet 0   doxycycline  (VIBRAMYCIN ) 100 MG capsule Take 1 capsule (100 mg total) by mouth 2 (two) times daily. 14 capsule 0   esomeprazole  (NEXIUM ) 20 MG capsule Take 20 mg by mouth daily at 12 noon. 1 tab a day     pantoprazole  (PROTONIX ) 40 MG tablet Take 1 tablet (40 mg total) by mouth in the morning 20 minutes before breakfast. (Patient not taking: Reported on 12/27/2023) 90 tablet 4   No facility-administered medications prior to visit.     EXAM:  BP 99/78 (BP Location: Left Arm, Patient Position: Sitting, Cuff Size: Normal)   Pulse 77   Temp 98.1 F (36.7 C) (Oral)   Ht 5' (1.524 m)   Wt 143 lb 3.2 oz (65 kg)   SpO2 98%   BMI 27.97 kg/m   Body mass index is 27.97 kg/m. Wt Readings from Last 3 Encounters:  12/27/23 143 lb 3.2 oz (65 kg)  03/23/23 139 lb (63 kg)  10/10/22 139 lb 9.6 oz (63.3 kg)    Physical Exam: Vital signs reviewed ZOX:WRUE is a well-developed well-nourished alert cooperative    who appearsr stated age in no acute distress.  HEENT: normocephalic atraumatic , Eyes: PERRL EOM's full, conjunctiva clear, Nares: paten,t no deformity discharge or tenderness., Ears: no deformity EAC's clear TMs  with normal landmarks. Mouth: clear OP, no lesions, edema.  Moist mucous membranes. Dentition in adequate repair. NECK: supple without masses, thyromegaly or bruits. CHEST/PULM:  Clear to auscultation and percussion breath sounds equal no wheeze , rales or rhonchi. No chest wall deformities or tenderness. Breast:  No dimpling, discharge, masses, tenderness or discharge . CV: PMI is nondisplaced, S1 S2 no gallops, murmurs, rubs. Peripheral pulses are full without delay.No JVD .  ABDOMEN: Bowel sounds normal nontender  No guard or rebound, no hepato splenomegal no CVA tenderness.  No hernia. Extremtities:  No clubbing cyanosis or edema, no acute joint swelling or redness no focal atrophy NEURO:  Oriented x3, cranial nerves 3-12 appear to be intact, no obvious focal weakness,gait within normal limits no abnormal reflexes or asymmetrical SKIN: No acute rashes normal turgor, color, no bruising or petechiae. PSYCH: Oriented, good eye contact, no obvious depression anxiety, cognition and judgment appear normal. LN: no cervical axillary iadenopathy  Lab Results  Component Value Date   WBC 6.5 12/27/2023   HGB 13.2 12/27/2023   HCT 40.0 12/27/2023   PLT 260.0 12/27/2023   GLUCOSE 93 12/27/2023   CHOL 184 12/27/2023   TRIG 95.0 12/27/2023   HDL 64.00 12/27/2023   LDLCALC 101 (H) 12/27/2023   ALT 22 12/27/2023   AST 17 12/27/2023   NA 141 12/27/2023   K 4.2 12/27/2023   CL 102 12/27/2023   CREATININE 0.61 12/27/2023   BUN 13 12/27/2023   CO2 31 12/27/2023   TSH 2.75 12/27/2023   INR 0.87 03/19/2010   HGBA1C 5.9 12/27/2023    BP Readings  from Last 3 Encounters:  12/27/23 99/78  05/17/23 99/68  03/23/23 100/60    Lab plan update reviewed with patient   ASSESSMENT AND PLAN:  Discussed the following assessment and plan:    ICD-10-CM   1. Visit for preventive health examination  Z00.00 CBC with Differential/Platelet    Comprehensive metabolic panel    Lipid panel    TSH     Hemoglobin A1c    Hep C Antibody    HIV antibody (with reflex)    Vitamin D , 25-hydroxy    DG Bone Density    2. Medication management  Z79.899 CBC with Differential/Platelet    Comprehensive metabolic panel    Lipid panel    TSH    Hemoglobin A1c    Hep C Antibody    HIV antibody (with reflex)    Vitamin D , 25-hydroxy    DG Bone Density    3. Malignant neoplasm of female breast, unspecified estrogen receptor status, unspecified laterality, unspecified site of breast (HCC)  C50.919 CBC with Differential/Platelet    Comprehensive metabolic panel    Lipid panel    TSH    Hemoglobin A1c    Hep C Antibody    HIV antibody (with reflex)    Vitamin D , 25-hydroxy   just finished  the antiestrogen med    4. High coronary artery calcium  score  R93.1 CBC with Differential/Platelet    Comprehensive metabolic panel    Lipid panel    TSH    Hemoglobin A1c    Hep C Antibody    HIV antibody (with reflex)    Vitamin D , 25-hydroxy   no sx  will try crestor  10 per day    5. H/O gastroesophageal reflux (GERD)  Z87.19 CBC with Differential/Platelet    Comprehensive metabolic panel    Lipid panel    TSH    Hemoglobin A1c    Hep C Antibody    HIV antibody (with reflex)    Vitamin D , 25-hydroxy    6. Need for shingles vaccine  Z23 Zoster Recombinant (Shingrix  )    7. Family history of osteoporosis  Z82.62 DG Bone Density    8. History of breast cancer  Z85.3 DG Bone Density    9. Hyperglycemia  R73.9     10. History of radiation therapy  Z92.3     Dexa and update labs  Suprapatellar   tendinitis mild intermittent    disc adding  rehab knee strengthening exercises  Return in about 1 year (around 12/26/2024) for depending on results.  Patient Care Team: Quetzaly Ebner, Joaquim Muir, MD as PCP - General Ivery Marking, MD as Attending Physician (Obstetrics and Gynecology) Alethia Huxley, MD (Hematology and Oncology) Patient Instructions  Knee exercises  resistance exercises.   Dg dexa  order breast center Checking vit d level today . Can begin crestor  for elevated ct calcium  score  We can do fu lab  in 3-6 months   Yearly check   Eiliyah Reh K. Lindsey Hommel M.D.

## 2023-12-27 ENCOUNTER — Ambulatory Visit (INDEPENDENT_AMBULATORY_CARE_PROVIDER_SITE_OTHER): Payer: Commercial Managed Care - PPO | Admitting: Internal Medicine

## 2023-12-27 ENCOUNTER — Encounter: Payer: Self-pay | Admitting: Internal Medicine

## 2023-12-27 ENCOUNTER — Other Ambulatory Visit: Payer: Self-pay | Admitting: Internal Medicine

## 2023-12-27 ENCOUNTER — Other Ambulatory Visit (HOSPITAL_COMMUNITY): Payer: Self-pay

## 2023-12-27 VITALS — BP 99/78 | HR 77 | Temp 98.1°F | Ht 60.0 in | Wt 143.2 lb

## 2023-12-27 DIAGNOSIS — Z23 Encounter for immunization: Secondary | ICD-10-CM

## 2023-12-27 DIAGNOSIS — C50919 Malignant neoplasm of unspecified site of unspecified female breast: Secondary | ICD-10-CM

## 2023-12-27 DIAGNOSIS — Z923 Personal history of irradiation: Secondary | ICD-10-CM | POA: Diagnosis not present

## 2023-12-27 DIAGNOSIS — Z8262 Family history of osteoporosis: Secondary | ICD-10-CM

## 2023-12-27 DIAGNOSIS — Z79899 Other long term (current) drug therapy: Secondary | ICD-10-CM | POA: Diagnosis not present

## 2023-12-27 DIAGNOSIS — Z8719 Personal history of other diseases of the digestive system: Secondary | ICD-10-CM

## 2023-12-27 DIAGNOSIS — Z0000 Encounter for general adult medical examination without abnormal findings: Secondary | ICD-10-CM | POA: Diagnosis not present

## 2023-12-27 DIAGNOSIS — Z853 Personal history of malignant neoplasm of breast: Secondary | ICD-10-CM | POA: Diagnosis not present

## 2023-12-27 DIAGNOSIS — R931 Abnormal findings on diagnostic imaging of heart and coronary circulation: Secondary | ICD-10-CM

## 2023-12-27 DIAGNOSIS — R739 Hyperglycemia, unspecified: Secondary | ICD-10-CM

## 2023-12-27 LAB — TSH: TSH: 2.75 u[IU]/mL (ref 0.35–5.50)

## 2023-12-27 LAB — CBC WITH DIFFERENTIAL/PLATELET
Basophils Absolute: 0 10*3/uL (ref 0.0–0.1)
Basophils Relative: 0.7 % (ref 0.0–3.0)
Eosinophils Absolute: 0.1 10*3/uL (ref 0.0–0.7)
Eosinophils Relative: 2.1 % (ref 0.0–5.0)
HCT: 40 % (ref 36.0–46.0)
Hemoglobin: 13.2 g/dL (ref 12.0–15.0)
Lymphocytes Relative: 45.2 % (ref 12.0–46.0)
Lymphs Abs: 3 10*3/uL (ref 0.7–4.0)
MCHC: 33.1 g/dL (ref 30.0–36.0)
MCV: 85.8 fL (ref 78.0–100.0)
Monocytes Absolute: 0.3 10*3/uL (ref 0.1–1.0)
Monocytes Relative: 3.8 % (ref 3.0–12.0)
Neutro Abs: 3.1 10*3/uL (ref 1.4–7.7)
Neutrophils Relative %: 48.2 % (ref 43.0–77.0)
Platelets: 260 10*3/uL (ref 150.0–400.0)
RBC: 4.66 Mil/uL (ref 3.87–5.11)
RDW: 13.1 % (ref 11.5–15.5)
WBC: 6.5 10*3/uL (ref 4.0–10.5)

## 2023-12-27 LAB — LIPID PANEL
Cholesterol: 184 mg/dL (ref 0–200)
HDL: 64 mg/dL (ref >39.00)
LDL Cholesterol: 101 mg/dL — ABNORMAL HIGH (ref 0–99)
NonHDL: 119.53
Total CHOL/HDL Ratio: 3
Triglycerides: 95 mg/dL (ref 0.0–149.0)
VLDL: 19 mg/dL (ref 0.0–40.0)

## 2023-12-27 LAB — COMPREHENSIVE METABOLIC PANEL
ALT: 22 U/L (ref 0–35)
AST: 17 U/L (ref 0–37)
Albumin: 4.4 g/dL (ref 3.5–5.2)
Alkaline Phosphatase: 78 U/L (ref 39–117)
BUN: 13 mg/dL (ref 6–23)
CO2: 31 meq/L (ref 19–32)
Calcium: 9.6 mg/dL (ref 8.4–10.5)
Chloride: 102 meq/L (ref 96–112)
Creatinine, Ser: 0.61 mg/dL (ref 0.40–1.20)
GFR: 99.28 mL/min (ref >60.00)
Glucose, Bld: 93 mg/dL (ref 70–99)
Potassium: 4.2 meq/L (ref 3.5–5.1)
Sodium: 141 meq/L (ref 135–145)
Total Bilirubin: 0.4 mg/dL (ref 0.2–1.2)
Total Protein: 7.3 g/dL (ref 6.0–8.3)

## 2023-12-27 LAB — VITAMIN D 25 HYDROXY (VIT D DEFICIENCY, FRACTURES): VITD: 42.17 ng/mL (ref 30.00–100.00)

## 2023-12-27 LAB — HEMOGLOBIN A1C: Hgb A1c MFr Bld: 5.9 % (ref 4.6–6.5)

## 2023-12-27 MED ORDER — ROSUVASTATIN CALCIUM 10 MG PO TABS
10.0000 mg | ORAL_TABLET | Freq: Every day | ORAL | 3 refills | Status: AC
  Filled 2023-12-27: qty 90, 90d supply, fill #0
  Filled 2024-04-04: qty 90, 90d supply, fill #1
  Filled 2024-06-12 – 2024-06-17 (×3): qty 90, 90d supply, fill #2
  Filled 2024-08-27 – 2024-09-03 (×2): qty 90, 90d supply, fill #3

## 2023-12-27 NOTE — Patient Instructions (Addendum)
 Knee exercises  resistance exercises.   Dg dexa order breast center Checking vit d level today . Can begin crestor  for elevated ct calcium  score  We can do fu lab  in 3-6 months   Yearly check

## 2023-12-27 NOTE — Progress Notes (Signed)
 Future lipid panel after crestor  rx

## 2023-12-28 ENCOUNTER — Other Ambulatory Visit: Payer: Self-pay | Admitting: Internal Medicine

## 2023-12-28 ENCOUNTER — Other Ambulatory Visit (HOSPITAL_COMMUNITY): Payer: Self-pay

## 2023-12-28 DIAGNOSIS — Z1231 Encounter for screening mammogram for malignant neoplasm of breast: Secondary | ICD-10-CM

## 2023-12-28 LAB — HIV ANTIBODY (ROUTINE TESTING W REFLEX): HIV 1&2 Ab, 4th Generation: NONREACTIVE

## 2023-12-28 LAB — HEPATITIS C ANTIBODY: Hepatitis C Ab: NONREACTIVE

## 2023-12-30 ENCOUNTER — Encounter: Payer: Self-pay | Admitting: Internal Medicine

## 2023-12-30 DIAGNOSIS — M810 Age-related osteoporosis without current pathological fracture: Secondary | ICD-10-CM

## 2023-12-30 NOTE — Progress Notes (Signed)
Labs in range ldl minimally elevated but would still try low dose statin as discussed

## 2024-01-01 ENCOUNTER — Ambulatory Visit
Admission: RE | Admit: 2024-01-01 | Discharge: 2024-01-01 | Disposition: A | Discharge status: Home / Self Care | Payer: Commercial Managed Care - PPO | Source: Ambulatory Visit | Attending: Internal Medicine | Admitting: Internal Medicine

## 2024-01-01 DIAGNOSIS — N958 Other specified menopausal and perimenopausal disorders: Secondary | ICD-10-CM | POA: Diagnosis not present

## 2024-01-01 DIAGNOSIS — Z79899 Other long term (current) drug therapy: Secondary | ICD-10-CM

## 2024-01-01 DIAGNOSIS — Z8262 Family history of osteoporosis: Secondary | ICD-10-CM

## 2024-01-01 DIAGNOSIS — M8588 Other specified disorders of bone density and structure, other site: Secondary | ICD-10-CM | POA: Diagnosis not present

## 2024-01-01 DIAGNOSIS — Z853 Personal history of malignant neoplasm of breast: Secondary | ICD-10-CM

## 2024-01-01 DIAGNOSIS — Z0000 Encounter for general adult medical examination without abnormal findings: Secondary | ICD-10-CM

## 2024-01-12 NOTE — Telephone Encounter (Signed)
You could consider  optimizing and exercise program with weight bearing  Resistant  diet manipulations  And repeat dexa in 2 years  before beginning  the yearly infusion of  bisphosphonate's .  However  I agree bisphosphonate would be first line for you.  If you want to proceed  with yearly reclast .  We can order this .   ( K  if she wishes can do order referral PA for reclast infusion )  Also if you want to discuss with endocrine  also about other life style interventions (Such as osteo strong  a paid program) I can order a endocrinology referral .

## 2024-01-26 ENCOUNTER — Ambulatory Visit
Admission: RE | Admit: 2024-01-26 | Discharge: 2024-01-26 | Disposition: A | Discharge status: Home / Self Care | Payer: Commercial Managed Care - PPO | Source: Ambulatory Visit | Attending: Internal Medicine | Admitting: Internal Medicine

## 2024-01-26 DIAGNOSIS — Z1231 Encounter for screening mammogram for malignant neoplasm of breast: Secondary | ICD-10-CM | POA: Diagnosis not present

## 2024-01-31 ENCOUNTER — Encounter: Payer: Self-pay | Admitting: Internal Medicine

## 2024-01-31 NOTE — Progress Notes (Signed)
Referral to endocrine has been done( sorry it was delayed )

## 2024-02-22 ENCOUNTER — Other Ambulatory Visit (HOSPITAL_COMMUNITY): Payer: Self-pay

## 2024-04-04 ENCOUNTER — Other Ambulatory Visit (HOSPITAL_COMMUNITY): Payer: Self-pay

## 2024-05-21 ENCOUNTER — Other Ambulatory Visit (HOSPITAL_COMMUNITY): Payer: Self-pay

## 2024-06-12 ENCOUNTER — Other Ambulatory Visit: Payer: Self-pay | Admitting: Dermatology

## 2024-06-12 ENCOUNTER — Other Ambulatory Visit (HOSPITAL_COMMUNITY): Payer: Self-pay

## 2024-06-12 DIAGNOSIS — R21 Rash and other nonspecific skin eruption: Secondary | ICD-10-CM

## 2024-06-13 ENCOUNTER — Other Ambulatory Visit (HOSPITAL_COMMUNITY): Payer: Self-pay

## 2024-06-17 ENCOUNTER — Other Ambulatory Visit (HOSPITAL_COMMUNITY): Payer: Self-pay

## 2024-06-17 MED ORDER — TACROLIMUS 0.1 % EX OINT
TOPICAL_OINTMENT | Freq: Two times a day (BID) | CUTANEOUS | 3 refills | Status: AC
  Filled 2024-06-17: qty 60, 60d supply, fill #0

## 2024-06-18 ENCOUNTER — Other Ambulatory Visit: Payer: Self-pay

## 2024-07-02 ENCOUNTER — Ambulatory Visit (HOSPITAL_BASED_OUTPATIENT_CLINIC_OR_DEPARTMENT_OTHER)
Admission: RE | Admit: 2024-07-02 | Discharge: 2024-07-02 | Disposition: A | Discharge status: Home / Self Care | Source: Ambulatory Visit | Attending: Internal Medicine | Admitting: Internal Medicine

## 2024-07-02 ENCOUNTER — Encounter: Payer: Self-pay | Admitting: Internal Medicine

## 2024-07-02 ENCOUNTER — Ambulatory Visit: Admitting: Internal Medicine

## 2024-07-02 ENCOUNTER — Other Ambulatory Visit (HOSPITAL_COMMUNITY): Payer: Self-pay

## 2024-07-02 ENCOUNTER — Ambulatory Visit: Payer: Self-pay | Admitting: Internal Medicine

## 2024-07-02 VITALS — BP 110/78 | HR 80 | Temp 98.2°F | Wt 142.1 lb

## 2024-07-02 DIAGNOSIS — Z1211 Encounter for screening for malignant neoplasm of colon: Secondary | ICD-10-CM | POA: Diagnosis not present

## 2024-07-02 DIAGNOSIS — K219 Gastro-esophageal reflux disease without esophagitis: Secondary | ICD-10-CM | POA: Diagnosis not present

## 2024-07-02 DIAGNOSIS — R200 Anesthesia of skin: Secondary | ICD-10-CM | POA: Diagnosis not present

## 2024-07-02 DIAGNOSIS — R140 Abdominal distension (gaseous): Secondary | ICD-10-CM | POA: Diagnosis not present

## 2024-07-02 DIAGNOSIS — G459 Transient cerebral ischemic attack, unspecified: Secondary | ICD-10-CM | POA: Diagnosis not present

## 2024-07-02 DIAGNOSIS — Z79899 Other long term (current) drug therapy: Secondary | ICD-10-CM

## 2024-07-02 DIAGNOSIS — K5904 Chronic idiopathic constipation: Secondary | ICD-10-CM | POA: Diagnosis not present

## 2024-07-02 DIAGNOSIS — R931 Abnormal findings on diagnostic imaging of heart and coronary circulation: Secondary | ICD-10-CM

## 2024-07-02 LAB — LIPID PANEL
Cholesterol: 161 mg/dL (ref 0–200)
HDL: 62.5 mg/dL (ref >39.00)
LDL Cholesterol: 77 mg/dL (ref 0–99)
NonHDL: 98.48
Total CHOL/HDL Ratio: 3
Triglycerides: 108 mg/dL (ref 0.0–149.0)
VLDL: 21.6 mg/dL (ref 0.0–40.0)

## 2024-07-02 LAB — COMPREHENSIVE METABOLIC PANEL WITH GFR
ALT: 16 U/L (ref 0–35)
AST: 17 U/L (ref 0–37)
Albumin: 4.1 g/dL (ref 3.5–5.2)
Alkaline Phosphatase: 72 U/L (ref 39–117)
BUN: 12 mg/dL (ref 6–23)
CO2: 28 meq/L (ref 19–32)
Calcium: 9.2 mg/dL (ref 8.4–10.5)
Chloride: 104 meq/L (ref 96–112)
Creatinine, Ser: 0.71 mg/dL (ref 0.40–1.20)
GFR: 94.08 mL/min (ref >60.00)
Glucose, Bld: 107 mg/dL — ABNORMAL HIGH (ref 70–99)
Potassium: 4.3 meq/L (ref 3.5–5.1)
Sodium: 140 meq/L (ref 135–145)
Total Bilirubin: 0.4 mg/dL (ref 0.2–1.2)
Total Protein: 7.1 g/dL (ref 6.0–8.3)

## 2024-07-02 LAB — VITAMIN D 25 HYDROXY (VIT D DEFICIENCY, FRACTURES): VITD: 55.88 ng/mL (ref 30.00–100.00)

## 2024-07-02 LAB — VITAMIN B12: Vitamin B-12: 501 pg/mL (ref 211–911)

## 2024-07-02 LAB — TSH: TSH: 1.75 u[IU]/mL (ref 0.35–5.50)

## 2024-07-02 MED ORDER — GOLYTELY 236 G PO SOLR
4000.0000 mL | Freq: Once | ORAL | 0 refills | Status: AC
  Filled 2024-07-02: qty 4000, 1d supply, fill #0

## 2024-07-02 NOTE — Progress Notes (Signed)
 Established Patient Office Visit     CC/Reason for Visit: Numbness left side of body  HPI: Renay Crammer is a 58 y.o. female who is coming in today for the above mentioned reasons. Past Medical History is significant for: Breast cancer.  She noted about 3 weeks ago some numbness of the ball of her left foot.  Then approximately 1 week ago noticed some paresthesias around her left eyelid with twitching and this has extended into her left hand.  No motor deficits.  No confusion, no balance issues, no falls.   Past Medical/Surgical History: Past Medical History:  Diagnosis Date   Breast cancer (HCC) 10/12/2009   right lumpectomy herpos ert neg dcis multilocal invasionN0M0 rs herceptin radiation 2011   DCIS (ductal carcinoma in situ) of breast 01/09/13   left    GERD (gastroesophageal reflux disease)    H/O gastroesophageal reflux (GERD)    endoscopy   Headache(784.0)    History of radiation therapy 04/06/10-05/18/10   right breast   History of radiation therapy 03/07/13-04/05/13   left breast/ 52.72 total Gy/58fx   Hx of migraines    Personal history of radiation therapy    PONV (postoperative nausea and vomiting)     Past Surgical History:  Procedure Laterality Date   ABDOMINAL HYSTERECTOMY  2008   BREAST LUMPECTOMY Right 2010   right   BREAST LUMPECTOMY Left 2014   BREAST LUMPECTOMY WITH NEEDLE LOCALIZATION Left 02/06/2013   Procedure: BREAST LUMPECTOMY WITH NEEDLE LOCALIZATION;  Surgeon: Jina Nephew, MD;  Location: [facility] OR;  Service: General;  Laterality: Left;  left breast needle localization lumpectomy    Social History:  reports that she has never smoked. She has never used smokeless tobacco. She reports that she does not drink alcohol and does not use drugs.  Allergies: Allergies  Allergen Reactions   Pseudoephedrine Hcl Other (See Comments)    sleepless    Family History:  Family History  Problem Relation Age of Onset   Hyperthyroidism Mother    Graves'  disease Mother    Diabetes Father    Hypertension Father    Eating disorder Daughter    Uterine cancer Maternal Aunt 83   Breast cancer Maternal Aunt        single mastectomy, no chemo; dx in her 23s   Stomach cancer Cousin 29       Klinefelter syndrome   Klinefelter's syndrome Cousin    Diabetes Mellitus II Cousin    Thalassemia Cousin        several paternal cousins with Beta Thal   Hypertension Brother    Hyperlipidemia Brother        one brother w/ hihg bp/ chol     Current Outpatient Medications:    acetaminophen  (TYLENOL ) 325 MG tablet, Take 325 mg by mouth daily as needed (for headache.)., Disp: , Rfl:    Calcium  Carbonate-Vitamin D  (CALCIUM  + D PO), Take 1 tablet by mouth daily. PRN, Disp: , Rfl:    Cholecalciferol (VITAMIN D3) 1000 UNITS CAPS, Take 1 capsule by mouth daily., Disp: , Rfl:    esomeprazole  (NEXIUM ) 40 MG capsule, Take 1 capsule (40 mg total) by mouth daily 20 minutes before breakfast., Disp: 90 capsule, Rfl: 4   famotidine  (PEPCID ) 40 MG tablet, Take 1 tablet (40 mg total) by mouth 2 (two) times daily., Disp: 180 tablet, Rfl: 4   ibuprofen (ADVIL,MOTRIN) 200 MG tablet, Take 200 mg by mouth every 6 (six) hours as needed for headache., Disp: ,  Rfl:    Naproxen Sodium (ALEVE PO), Take by mouth. With tylenol  PRN, Disp: , Rfl:    rosuvastatin  (CRESTOR ) 10 MG tablet, Take 1 tablet (10 mg total) by mouth daily., Disp: 90 tablet, Rfl: 3   tacrolimus  (PROTOPIC ) 0.1 % ointment, Apply topically 2 (two) times daily until areas clear., Disp: 60 g, Rfl: 3   b complex vitamins capsule, Take 1 capsule by mouth daily. PRN (Patient not taking: Reported on 07/02/2024), Disp: , Rfl:    gabapentin  (NEURONTIN ) 300 MG capsule, Take 1 capsule (300 mg total) by mouth 3 (three) times daily. (Patient not taking: Reported on 07/02/2024), Disp: 90 capsule, Rfl: 6   Levocetirizine Dihydrochloride (XYZAL PO), Take by mouth. PRN (Patient not taking: Reported on 07/02/2024), Disp: , Rfl:     montelukast  (SINGULAIR ) 10 MG tablet, Take 1 tablet (10 mg total) by mouth daily. (Patient not taking: Reported on 07/02/2024), Disp: 30 tablet, Rfl: 2   ondansetron  (ZOFRAN -ODT) 4 MG disintegrating tablet, Take 1 tablet (4 mg total) and place on top of the tongue where it will dissolve, then swallow every 8 (eight) hours as needed. (Patient not taking: Reported on 07/02/2024), Disp: 42 tablet, Rfl: 0   tamoxifen  (NOLVADEX ) 20 MG tablet, Take 1 tablet (20 mg total) by mouth daily. (Patient not taking: Reported on 07/02/2024), Disp: 90 tablet, Rfl: 0  Review of Systems:  Negative unless indicated in HPI.   Physical Exam: Vitals:   07/02/24 0838  BP: 110/78  Pulse: 80  Temp: 98.2 F (36.8 C)  TempSrc: Oral  SpO2: 99%  Weight: 142 lb 1.6 oz (64.5 kg)    Body mass index is 27.75 kg/m.   Physical Exam Vitals reviewed.  Constitutional:      Appearance: Normal appearance.  HENT:     Head: Normocephalic and atraumatic.  Eyes:     Conjunctiva/sclera: Conjunctivae normal.  Cardiovascular:     Rate and Rhythm: Normal rate and regular rhythm.  Pulmonary:     Effort: Pulmonary effort is normal.     Breath sounds: Normal breath sounds.  Skin:    General: Skin is warm and dry.  Neurological:     General: No focal deficit present.     Mental Status: She is alert and oriented to person, place, and time.  Psychiatric:        Mood and Affect: Mood normal.        Behavior: Behavior normal.        Thought Content: Thought content normal.        Judgment: Judgment normal.      Impression and Plan:  Left sided numbness -     TSH; Future -     Vitamin B12; Future -     VITAMIN D  25 Hydroxy (Vit-D Deficiency, Fractures); Future -     Comprehensive metabolic panel with GFR; Future -     CT HEAD WO CONTRAST ( ); Future  High coronary artery calcium  score -     Lipid panel  Medication management -     Lipid panel   - Diagnosis unclear, but given laterality I think important to  image the brain, especially with her history of breast cancer.  Urgent CT head requested. - Will also check labs to include B12 and TSH.  Time spent:31 minutes reviewing chart, interviewing and examining patient and formulating plan of care.     Tully Theophilus Andrews, MD  Primary Care at [facility]

## 2024-07-05 ENCOUNTER — Other Ambulatory Visit (HOSPITAL_COMMUNITY): Payer: Self-pay

## 2024-07-15 ENCOUNTER — Ambulatory Visit: Admitting: "Endocrinology

## 2024-07-15 ENCOUNTER — Ambulatory Visit: Payer: Self-pay | Admitting: Internal Medicine

## 2024-07-15 DIAGNOSIS — Q438 Other specified congenital malformations of intestine: Secondary | ICD-10-CM | POA: Diagnosis not present

## 2024-07-15 DIAGNOSIS — K219 Gastro-esophageal reflux disease without esophagitis: Secondary | ICD-10-CM | POA: Diagnosis not present

## 2024-07-15 DIAGNOSIS — Z1211 Encounter for screening for malignant neoplasm of colon: Secondary | ICD-10-CM | POA: Diagnosis not present

## 2024-07-15 DIAGNOSIS — K2950 Unspecified chronic gastritis without bleeding: Secondary | ICD-10-CM | POA: Diagnosis not present

## 2024-07-15 DIAGNOSIS — K5730 Diverticulosis of large intestine without perforation or abscess without bleeding: Secondary | ICD-10-CM | POA: Diagnosis not present

## 2024-07-15 DIAGNOSIS — K319 Disease of stomach and duodenum, unspecified: Secondary | ICD-10-CM | POA: Diagnosis not present

## 2024-07-15 DIAGNOSIS — K317 Polyp of stomach and duodenum: Secondary | ICD-10-CM | POA: Diagnosis not present

## 2024-07-15 NOTE — Progress Notes (Signed)
 Cholesterol panel results in good range  on rosuvastatin   ldl 70 would be optimum

## 2024-08-21 ENCOUNTER — Ambulatory Visit: Admitting: "Endocrinology

## 2024-08-21 ENCOUNTER — Encounter: Payer: Self-pay | Admitting: "Endocrinology

## 2024-08-21 ENCOUNTER — Telehealth: Payer: Self-pay

## 2024-08-21 VITALS — BP 100/80 | HR 74 | Ht 60.0 in | Wt 144.0 lb

## 2024-08-21 DIAGNOSIS — M810 Age-related osteoporosis without current pathological fracture: Secondary | ICD-10-CM | POA: Diagnosis not present

## 2024-08-21 NOTE — Progress Notes (Signed)
 [facility] Note Michelle Birmingham, MD    Referring Provider: Charlett Apolinar POUR, MD Primary Care Provider: Charlett Apolinar POUR, MD No chief complaint on file.  Assessment & Plan  Diagnoses and all orders for this visit:  Age-related osteoporosis without current pathological fracture  Other orders -     0.9 %  sodium chloride  infusion -     alteplase (CATHFLO ACTIVASE) injection 2 mg -     heparin lock flush 100 unit/mL -     sodium chloride  flush (NS) 0.9 % injection 10 mL -     sodium chloride  flush (NS) 0.9 % injection 3 mL -     anticoagulant sodium citrate solution 5 mL -     heparin lock flush 100 unit/mL -     acetaminophen  (TYLENOL ) tablet 650 mg -     diphenhydrAMINE (BENADRYL) capsule 25 mg -     zoledronic acid (RECLAST) injection 5 mg -     famotidine  (PEPCID ) 20 mg in sodium chloride  0.9 % 50 mL IVPB -     0.9 %  sodium chloride  infusion -     methylPREDNISolone sodium succinate (SOLU-MEDROL) 125 mg/2 mL injection 125 mg -     diphenhydrAMINE (BENADRYL) injection 50 mg -     albuterol (VENTOLIN HFA) 108 (90 Base) MCG/ACT inhaler 2 puff -     EPINEPHrine  (EPI-PEN) injection 0.3 mg     Osteoporosis Likely secondary cause from age + pre-menopausal tamoxifen  use. 01/01/24: DXA AP Spine L2-L4 (L3) T score was at -2.6. Recommend to use calcium  600 mg twice daily and vitamin D  2000 units OTC supplements.  Discussed weight bearing exercise options and dietary supplements.   Educated on risks and side effects of reclast including but not limited to atypical femoral fractures and osteonecrosis of the jaw.  Patient is not interested in fosamax due to esophagitis/worsening GERD.  Follow fall precautions, adequate dairy in diet and exercises (aerobic, balancing and weight bearing) as tolerated.   No follow-ups on file.  I have reviewed current medications, nurse's notes, allergies, vital signs, past medical and surgical history, family medical history, and social  history for this encounter. Counseled patient on symptoms, examination findings, lab findings, imaging results, treatment decisions and monitoring and prognosis. The patient understood the recommendations and agrees with the treatment plan. All questions regarding treatment plan were fully answered.   Michelle Birmingham, MD   08/21/24  History of Present Illness Michelle Wagner is a 58 y.o. year old female who presents to our clinic with osteoporosis diagnosed in 12/2023. She is currently taking Calcium  500 mg once daily (not regular) and vitamin D  5000 international units once daily (not regular).  Risk Factors screening:  History of low trauma fractures: No Family history of osteoporosis: Yes Hip fracture in first-degree relatives: No Smoking history: No Excessive alcohol intake >2 drinks/day: No Excessive caffeine intake >2 drinks/day: No Glucocorticoid use >5mg  prednisone /day for >3 months: No Rheumatoid arthritis history: No Premature/Surgical Menopause: No History of breast cancer in 2010 with tamoxifen  for 11 years   Anti-epileptic drugs No Celiac disease/signs of malabsorption No Gastric bypass/gastrectomy No PPI use yes, Nexium   TZD use No Thyroid  hormone suppressive therapy No  01/01/24: DXA  AP Spine L2-L4 (L3) 01/01/2024 57.3 -2.6 0.883 g/cm2   DualFemur Neck Left 01/01/2024 57.3 -0.8 0.921 g/cm2   DualFemur Total Mean 01/01/2024 57.3 -0.3 0.969 g/cm2   Left Forearm Radius 33% 01/01/2024 57.3 -1.1 0.779 g/cm2  Physical Exam  BP  100/80   Pulse 74   Ht 5' (1.524 m)   Wt 144 lb (65.3 kg)   SpO2 98%   BMI 28.12 kg/m  Constitutional: well developed, well nourished Head: normocephalic, atraumatic Eyes: sclera anicteric, no redness Neck: supple Lungs: normal respiratory effort Neurology: alert and oriented Skin: dry, no appreciable rashes Musculoskeletal: no appreciable defects Psychiatric: normal mood and affect  Allergies Allergies  Allergen Reactions    Pseudoephedrine Hcl Other (See Comments)    sleepless    Current Medications Patient's Medications  New Prescriptions   No medications on file  Previous Medications   ACETAMINOPHEN  (TYLENOL ) 325 MG TABLET    Take 325 mg by mouth daily as needed (for headache.).   B COMPLEX VITAMINS CAPSULE    Take 1 capsule by mouth daily. PRN   CALCIUM  CARBONATE-VITAMIN D  (CALCIUM  + D PO)    Take 1 tablet by mouth daily. PRN   CHOLECALCIFEROL (VITAMIN D3) 1000 UNITS CAPS    Take 1 capsule by mouth daily.   ESOMEPRAZOLE  (NEXIUM ) 40 MG CAPSULE    Take 1 capsule (40 mg total) by mouth daily 20 minutes before breakfast.   FAMOTIDINE  (PEPCID ) 40 MG TABLET    Take 1 tablet (40 mg total) by mouth 2 (two) times daily.   GABAPENTIN  (NEURONTIN ) 300 MG CAPSULE    Take 1 capsule (300 mg total) by mouth 3 (three) times daily.   IBUPROFEN (ADVIL,MOTRIN) 200 MG TABLET    Take 200 mg by mouth every 6 (six) hours as needed for headache.   LEVOCETIRIZINE DIHYDROCHLORIDE (XYZAL PO)    Take by mouth. PRN   MONTELUKAST  (SINGULAIR ) 10 MG TABLET    Take 1 tablet (10 mg total) by mouth daily.   NAPROXEN SODIUM (ALEVE PO)    Take by mouth. With tylenol  PRN   ONDANSETRON  (ZOFRAN -ODT) 4 MG DISINTEGRATING TABLET    Take 1 tablet (4 mg total) and place on top of the tongue where it will dissolve, then swallow every 8 (eight) hours as needed.   ROSUVASTATIN  (CRESTOR ) 10 MG TABLET    Take 1 tablet (10 mg total) by mouth daily.   TACROLIMUS  (PROTOPIC ) 0.1 % OINTMENT    Apply topically 2 (two) times daily until areas clear.   TAMOXIFEN  (NOLVADEX ) 20 MG TABLET    Take 1 tablet (20 mg total) by mouth daily.  Modified Medications   No medications on file  Discontinued Medications   No medications on file     Past Medical History Past Medical History:  Diagnosis Date   Breast cancer (HCC) 10/12/2009   right lumpectomy herpos ert neg dcis multilocal [id number] rs herceptin radiation 2011   DCIS (ductal carcinoma in situ) of breast  01/09/13   left    GERD (gastroesophageal reflux disease)    H/O gastroesophageal reflux (GERD)    endoscopy   Headache(784.0)    History of radiation therapy 04/06/10-05/18/10   right breast   History of radiation therapy 03/07/13-04/05/13   left breast/ 52.72 total Gy/64fx   Hx of migraines    Personal history of radiation therapy    PONV (postoperative nausea and vomiting)     Past Surgical History Past Surgical History:  Procedure Laterality Date   ABDOMINAL HYSTERECTOMY  2008   BREAST LUMPECTOMY Right 2010   right   BREAST LUMPECTOMY Left 2014   BREAST LUMPECTOMY WITH NEEDLE LOCALIZATION Left 02/06/2013   Procedure: BREAST LUMPECTOMY WITH NEEDLE LOCALIZATION;  Surgeon: Jina Nephew, MD;  Location: [facility] OR;  Service:  General;  Laterality: Left;  left breast needle localization lumpectomy    Family History family history includes Breast cancer in her maternal aunt; Diabetes in her father; Diabetes Mellitus II in her cousin; Eating disorder in her daughter; Yvone' disease in her mother; Hyperlipidemia in her brother; Hypertension in her brother and father; Hyperthyroidism in her mother; Klinefelter's syndrome in her cousin; Stomach cancer (age of onset: 4) in her cousin; Thalassemia in her cousin; Uterine cancer (age of onset: 72) in her maternal aunt.  Social History Social History   Socioeconomic History   Marital status: Married    Spouse name: Not on file   Number of children: Not on file   Years of education: Not on file   Highest education level: Not on file  Occupational History   Not on file  Tobacco Use   Smoking status: Never   Smokeless tobacco: Never  Substance and Sexual Activity   Alcohol use: No   Drug use: No   Sexual activity: Yes  Other Topics Concern   Not on file  Social History Narrative   hh of  7  Husband children and MInLAW   Pet 1 cats.    Neg tobacco   Masters level education graduated  And residency med school    G4P4   To work vol at  hospital    Lots of driving her children around                Social Drivers of [employer] Resource Strain: Not on file  Food Insecurity: Not on file  Transportation Needs: Not on file  Physical Activity: Inactive (12/27/2023)   Exercise Vital Sign    Days of Exercise per Week: 0 days    Minutes of Exercise per Session: 0 min  Stress: No Stress Concern Present (12/27/2023)   [employer] of Occupational Health - Occupational Stress Questionnaire    Feeling of Stress : Not at all  Social Connections: Socially Integrated (12/27/2023)   Social Connection and Isolation Panel    Frequency of Communication with Friends and Family: More than three times a week    Frequency of Social Gatherings with Friends and Family: Once a week    Attends Religious Services: More than 4 times per year    Active Member of [employer] or Organizations: Yes    Attends Engineer, structural: More than 4 times per year    Marital Status: Married  Catering manager Violence: Unknown (03/18/2022)   Received from [facility]   HITS    Physically Hurt: Not on file    Insult or Talk Down To: Not on file    Threaten Physical Harm: Not on file    Scream or Curse: Not on file    Laboratory Investigations No components found for: CMP No components found for: BMP Lab Results  Component Value Date   GFR 94.08 07/02/2024   Lab Results  Component Value Date   CREATININE 0.71 07/02/2024   No results found for: CBC No components found for: LFT No components found for: VITD No results found for: PTH  Lab Results  Component Value Date   TSH 1.75 07/02/2024    No components found for: RENAL FUNCTION No components found for: MAGNESIUM   Parts of this note may have been dictated using voice recognition software. There may be variances in spelling and vocabulary which are unintentional. Not all errors are proofread. Please notify the dino if any discrepancies are noted or if  the meaning of any statement is not clear.

## 2024-08-21 NOTE — Telephone Encounter (Signed)
 Dr. Motwani, patient will be scheduled as soon as possible.  Auth Submission: NO AUTH NEEDED Site of care: Site of care: [facility] Payer: [employer] commercial Medication & CPT/J Code(s) submitted: Reclast (Zolendronic acid) [id number] Diagnosis Code:  Route of submission (phone, fax, portal): portal Phone # Fax # Auth type: Buy/Bill PB Units/visits requested: 5mg  x 1 dose Reference number:  Approval from: 08/21/24 to 12/11/24

## 2024-08-27 ENCOUNTER — Other Ambulatory Visit (HOSPITAL_COMMUNITY): Payer: Self-pay

## 2024-09-05 ENCOUNTER — Other Ambulatory Visit (HOSPITAL_COMMUNITY): Payer: Self-pay

## 2024-09-23 ENCOUNTER — Ambulatory Visit

## 2024-10-03 ENCOUNTER — Ambulatory Visit (INDEPENDENT_AMBULATORY_CARE_PROVIDER_SITE_OTHER): Admitting: *Deleted

## 2024-10-03 VITALS — BP 108/74 | HR 67 | Temp 97.9°F | Resp 18 | Ht 61.0 in | Wt 146.0 lb

## 2024-10-03 DIAGNOSIS — M810 Age-related osteoporosis without current pathological fracture: Secondary | ICD-10-CM | POA: Diagnosis not present

## 2024-10-03 MED ORDER — SODIUM CHLORIDE 0.9 % IV SOLN: INTRAVENOUS | Status: DC

## 2024-10-03 MED ORDER — ACETAMINOPHEN 325 MG PO TABS: 650.0000 mg | ORAL_TABLET | Freq: Once | ORAL | Status: DC

## 2024-10-03 MED ORDER — ZOLEDRONIC ACID 5 MG/100ML IV SOLN
5.0000 mg | Freq: Once | INTRAVENOUS | Status: AC
  Administered 2024-10-03: 5 mg via INTRAVENOUS
  Filled 2024-10-03: qty 100

## 2024-10-03 MED ORDER — DIPHENHYDRAMINE HCL 25 MG PO CAPS: 25.0000 mg | ORAL_CAPSULE | Freq: Once | ORAL | Status: DC

## 2024-10-03 NOTE — Patient Instructions (Signed)

## 2024-10-03 NOTE — Progress Notes (Signed)
 Diagnosis: Osteoporosis  Provider:  Mannam, Praveen MD  Procedure: IV Infusion  IV Type: Peripheral, IV Location: L Antecubital  Reclast (Zolendronic Acid), Dose: 5 mg  Infusion Start Time: 1529 pm  Infusion Stop Time: 1600 pm  Post Infusion IV Care: Observation period completed and Peripheral IV Discontinued  Discharge: Condition: Good, Destination: Home . AVS Declined  Performed by:  Trudy Lamarr LABOR, RN

## 2024-11-04 ENCOUNTER — Other Ambulatory Visit (HOSPITAL_COMMUNITY): Payer: Self-pay

## 2024-11-11 ENCOUNTER — Other Ambulatory Visit (HOSPITAL_COMMUNITY): Payer: Self-pay

## 2024-11-11 MED ORDER — ESOMEPRAZOLE MAGNESIUM 40 MG PO CPDR
40.0000 mg | DELAYED_RELEASE_CAPSULE | Freq: Every day | ORAL | 4 refills | Status: AC
  Filled 2024-11-11: qty 90, 90d supply, fill #0

## 2024-12-23 ENCOUNTER — Other Ambulatory Visit

## 2024-12-25 ENCOUNTER — Other Ambulatory Visit

## 2024-12-26 LAB — RENAL FUNCTION PANEL
Albumin: 4.2 g/dL (ref 3.6–5.1)
BUN: 14 mg/dL (ref 7–25)
CO2: 28 mmol/L (ref 20–32)
Calcium: 9.4 mg/dL (ref 8.6–10.4)
Chloride: 103 mmol/L (ref 98–110)
Creat: 0.6 mg/dL (ref 0.50–1.03)
Glucose, Bld: 112 mg/dL — ABNORMAL HIGH (ref 65–99)
Phosphorus: 4.2 mg/dL (ref 2.5–4.5)
Potassium: 4.5 mmol/L (ref 3.5–5.3)
Sodium: 139 mmol/L (ref 135–146)

## 2024-12-26 LAB — VITAMIN D 25 HYDROXY (VIT D DEFICIENCY, FRACTURES): Vit D, 25-Hydroxy: 51 ng/mL (ref 30–100)

## 2024-12-27 ENCOUNTER — Encounter: Payer: Self-pay | Admitting: "Endocrinology

## 2024-12-27 ENCOUNTER — Telehealth: Admitting: "Endocrinology

## 2024-12-27 VITALS — Ht 61.0 in | Wt 146.0 lb

## 2024-12-27 DIAGNOSIS — M810 Age-related osteoporosis without current pathological fracture: Secondary | ICD-10-CM | POA: Diagnosis not present

## 2024-12-27 NOTE — Progress Notes (Signed)
 "   The patient reports they are currently: Michelle Wagner. I spent 8 minutes on the video with the patient on the date of service. I spent an additional 2-3 minutes on pre- and post-visit activities on the date of service.   The patient was physically located in [place]  or a state in which I am permitted to provide care. The patient and/or parent/guardian understood that s/he may incur co-pays and cost sharing, and agreed to the telemedicine visit. The visit was reasonable and appropriate under the circumstances given the patient's presentation at the time.  The patient and/or parent/guardian understands the potential risks and limitations of this mode of treatment (including, but not limited to, the absence of in-person examination) and has agreed to be treated using telemedicine. The patient's/patient's family's questions regarding telemedicine have been answered.   The patient and/or parent/guardian will contact their provider's office for worsening conditions, and seek emergency medical treatment and/or call 911 if the patient deems either necessary.      [facility] Note [name], MD    Referring Provider: Charlett Apolinar POUR, MD Primary Care Provider: Charlett Apolinar POUR, MD No chief complaint on file.  Assessment & Plan  Diagnoses and all orders for this visit:  Age-related osteoporosis without current pathological fracture      Osteoporosis Likely secondary cause from age + pre-menopausal tamoxifen  use. 01/01/24: DXA AP Spine L2-L4 (L3) T score was at -2.6. Recommend to use calcium  600 mg once daily and vitamin D  2000 units OTC supplements.  Discussed weight bearing exercise options and dietary supplements.   S/p reclast  10/03/24  Needed round the clock tylenol  for body aches for one day after reclast   Educated on risks and side effects of reclast  including but not limited to atypical femoral fractures and osteonecrosis of the jaw.  Patient is not interested in  fosamax due to esophagitis/worsening GERD.  Follow fall precautions, adequate dairy in diet and exercises (aerobic, balancing and weight bearing) as tolerated.   Return in about 1 year (around 12/27/2025) for visit + labs before next visit.  I have reviewed current medications, nurse's notes, allergies, vital signs, past medical and surgical history, family medical history, and social history for this encounter. Counseled patient on symptoms, examination findings, lab findings, imaging results, treatment decisions and monitoring and prognosis. The patient understood the recommendations and agrees with the treatment plan. All questions regarding treatment plan were fully answered.   [name], MD   12/27/24  History of Present Illness Michelle Wagner is a 59 y.o. year old female who presents to our clinic with osteoporosis diagnosed in 12/2023. She is currently taking Calcium  500 mg once daily (not regular) and vitamin D  5000 international units once every other day or so.  No falls/fracture Needed round the clock tylenol  for body aches for one day after reclast  Patient is not very active so plans to start pilates class  Initial history: Risk Factors screening:  History of low trauma fractures: No Family history of osteoporosis: Yes Hip fracture in first-degree relatives: No Smoking history: No Excessive alcohol intake >2 drinks/day: No Excessive caffeine intake >2 drinks/day: No Glucocorticoid use >5mg  prednisone /day for >3 months: No Rheumatoid arthritis history: No Premature/Surgical Menopause: No History of breast cancer in 2010 with tamoxifen  for 11 years   Anti-epileptic drugs No Celiac disease/signs of malabsorption No Gastric bypass/gastrectomy No PPI use yes, Nexium   TZD use No Thyroid  hormone suppressive therapy No  01/01/24: DXA  AP Spine L2-L4 (L3) 01/01/2024 57.3 -2.6  0.883 g/cm2   DualFemur Neck Left 01/01/2024 57.3 -0.8 0.921 g/cm2   DualFemur Total Mean  01/01/2024 57.3 -0.3 0.969 g/cm2   Left Forearm Radius 33% 01/01/2024 57.3 -1.1 0.779 g/cm2  Physical Exam  Ht 5' 1 (1.549 m)   Wt 146 lb (66.2 kg)   BMI 27.59 kg/m  Constitutional: well developed, well nourished Head: normocephalic, atraumatic Eyes: sclera anicteric, no redness Neck: supple Lungs: normal respiratory effort Neurology: alert and oriented Skin: dry, no appreciable rashes Musculoskeletal: no appreciable defects Psychiatric: normal mood and affect  Allergies Allergies  Allergen Reactions   Pseudoephedrine Hcl Other (See Comments)    sleepless    Current Medications Patient's Medications  New Prescriptions   No medications on file  Previous Medications   ACETAMINOPHEN  (TYLENOL ) 325 MG TABLET    Take 325 mg by mouth daily as needed (for headache.).   B COMPLEX VITAMINS CAPSULE    Take 1 capsule by mouth daily. PRN   CALCIUM  CARBONATE-VITAMIN D  (CALCIUM  + D PO)    Take 1 tablet by mouth daily. PRN   CHOLECALCIFEROL (VITAMIN D3) 1000 UNITS CAPS    Take 1 capsule by mouth daily.   ESOMEPRAZOLE  (NEXIUM ) 40 MG CAPSULE    Take 1 capsule (40 mg total) by mouth daily 20 minutes before breakfast.   FAMOTIDINE  (PEPCID ) 40 MG TABLET    Take 1 tablet (40 mg total) by mouth 2 (two) times daily.   GABAPENTIN  (NEURONTIN ) 300 MG CAPSULE    Take 1 capsule (300 mg total) by mouth 3 (three) times daily.   IBUPROFEN (ADVIL,MOTRIN) 200 MG TABLET    Take 200 mg by mouth every 6 (six) hours as needed for headache.   LEVOCETIRIZINE DIHYDROCHLORIDE (XYZAL PO)    Take by mouth. PRN   MONTELUKAST  (SINGULAIR ) 10 MG TABLET    Take 1 tablet (10 mg total) by mouth daily.   NAPROXEN SODIUM (ALEVE PO)    Take by mouth. With tylenol  PRN   ONDANSETRON  (ZOFRAN -ODT) 4 MG DISINTEGRATING TABLET    Take 1 tablet (4 mg total) and place on top of the tongue where it will dissolve, then swallow every 8 (eight) hours as needed.   ROSUVASTATIN  (CRESTOR ) 10 MG TABLET    Take 1 tablet (10 mg total) by mouth  daily.   TACROLIMUS  (PROTOPIC ) 0.1 % OINTMENT    Apply topically 2 (two) times daily until areas clear.   TAMOXIFEN  (NOLVADEX ) 20 MG TABLET    Take 1 tablet (20 mg total) by mouth daily.  Modified Medications   No medications on file  Discontinued Medications   No medications on file     Past Medical History Past Medical History:  Diagnosis Date   Breast cancer (HCC) 10/12/2009   right lumpectomy herpos ert neg dcis multilocal invasionN0M0 rs herceptin radiation 2011   DCIS (ductal carcinoma in situ) of breast 01/09/13   left    GERD (gastroesophageal reflux disease)    H/O gastroesophageal reflux (GERD)    endoscopy   Headache(784.0)    History of radiation therapy 04/06/10-05/18/10   right breast   History of radiation therapy 03/07/13-04/05/13   left breast/ 52.72 total Gy/47fx   Hx of migraines    Personal history of radiation therapy    PONV (postoperative nausea and vomiting)     Past Surgical History Past Surgical History:  Procedure Laterality Date   ABDOMINAL HYSTERECTOMY  2008   BREAST LUMPECTOMY Right 2010   right   BREAST LUMPECTOMY Left 2014  BREAST LUMPECTOMY WITH NEEDLE LOCALIZATION Left 02/06/2013   Procedure: BREAST LUMPECTOMY WITH NEEDLE LOCALIZATION;  Surgeon: Jina Nephew, MD;  Location: [facility] OR;  Service: General;  Laterality: Left;  left breast needle localization lumpectomy    Family History family history includes Breast cancer in her maternal aunt; Diabetes in her father; Diabetes Mellitus II in her cousin; Eating disorder in her daughter; Yvone' disease in her mother; Hyperlipidemia in her brother; Hypertension in her brother and father; Hyperthyroidism in her mother; Klinefelter's syndrome in her cousin; Stomach cancer (age of onset: 49) in her cousin; Thalassemia in her cousin; Uterine cancer (age of onset: 64) in her maternal aunt.  Social History Social History   Socioeconomic History   Marital status: Married    Spouse name: Not on file   Number  of children: Not on file   Years of education: Not on file   Highest education level: Not on file  Occupational History   Not on file  Tobacco Use   Smoking status: Never   Smokeless tobacco: Never  Substance and Sexual Activity   Alcohol use: No   Drug use: No   Sexual activity: Yes  Other Topics Concern   Not on file  Social History Narrative   hh of  7  Husband children and MInLAW   Pet 1 cats.    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 around                Social Drivers of Health   Tobacco Use: Low Risk (12/27/2024)   Patient History    Smoking Tobacco Use: Never    Smokeless Tobacco Use: Never    Passive Exposure: Not on file  Financial Resource Strain: Not on file  Food Insecurity: Not on file  Transportation Needs: Not on file  Physical Activity: Inactive (12/27/2023)   Exercise Vital Sign    Days of Exercise per Week: 0 days    Minutes of Exercise per Session: 0 min  Stress: No Stress Concern Present (12/27/2023)   [employer] of Occupational Health - Occupational Stress Questionnaire    Feeling of Stress : Not at all  Social Connections: Socially Integrated (12/27/2023)   Social Connection and Isolation Panel    Frequency of Communication with Friends and Family: More than three times a week    Frequency of Social Gatherings with Friends and Family: Once a week    Attends Religious Services: More than 4 times per year    Active Member of [employer] or Organizations: Yes    Attends Banker Meetings: More than 4 times per year    Marital Status: Married  Catering Manager Violence: Unknown (03/18/2022)   Received from [facility]   HITS    Physically Hurt: Not on file    Insult or Talk Down To: Not on file    Threaten Physical Harm: Not on file    Scream or Curse: Not on file  Depression (PHQ2-9): Low Risk (12/27/2023)   Depression (PHQ2-9)     PHQ-2 Score: 0  Alcohol Screen: Low Risk (12/27/2023)   Alcohol Screen    Last Alcohol Screening Score (AUDIT): 0  Housing: Not on file  Utilities: Not on file  Health Literacy: Not on file    Laboratory Investigations No components found for: CMP No components found for: BMP Lab Results  Component Value Date   GFR 94.08 07/02/2024   Lab Results  Component Value Date   CREATININE 0.60 12/25/2024   No results found for: CBC No components found for: LFT No components found for: VITD No results found for: PTH  Lab Results  Component Value Date   TSH 1.75 07/02/2024    No components found for: RENAL FUNCTION No components found for: MAGNESIUM   Parts of this note may have been dictated using voice recognition software. There may be variances in spelling and vocabulary which are unintentional. Not all errors are proofread. Please notify the dino if any discrepancies are noted or if the meaning of any statement is not clear.  "

## 2024-12-27 NOTE — Patient Instructions (Signed)
 Risks and side effects of reclast  include but is not limited to atypical femoral fractures and osteonecrosis of the jaw. Keep up with your dentist.

## 2025-12-15 ENCOUNTER — Other Ambulatory Visit

## 2025-12-22 ENCOUNTER — Ambulatory Visit: Admitting: "Endocrinology
